# Patient Record
Sex: Female | Born: 1995 | Hispanic: Yes | Marital: Single | Smoking: Never smoker
Health system: Southern US, Community
[De-identification: ages and names within clinical notes are randomized; demographics above are authoritative.]

## PROBLEM LIST (undated history)

## (undated) ENCOUNTER — Encounter: Attending: Internal Medicine | Primary: Internal Medicine

## (undated) ENCOUNTER — Encounter

## (undated) ENCOUNTER — Ambulatory Visit: Payer: MEDICARE | Attending: Internal Medicine | Primary: Internal Medicine

## (undated) ENCOUNTER — Ambulatory Visit: Payer: MEDICARE

## (undated) ENCOUNTER — Ambulatory Visit: Payer: MEDICARE | Attending: Podiatrist | Primary: Podiatrist

## (undated) ENCOUNTER — Ambulatory Visit

## (undated) ENCOUNTER — Encounter: Payer: MEDICARE | Attending: Internal Medicine | Primary: Internal Medicine

## (undated) ENCOUNTER — Ambulatory Visit: Payer: MEDICARE | Attending: Medical | Primary: Medical

## (undated) ENCOUNTER — Ambulatory Visit: Attending: Pharmacist | Primary: Pharmacist

## (undated) ENCOUNTER — Encounter: Attending: Rheumatology | Primary: Rheumatology

## (undated) ENCOUNTER — Telehealth

## (undated) ENCOUNTER — Encounter: Attending: Podiatrist | Primary: Podiatrist

## (undated) ENCOUNTER — Encounter: Attending: Oncology | Primary: Oncology

## (undated) ENCOUNTER — Encounter: Attending: Physical Medicine & Rehabilitation | Primary: Physical Medicine & Rehabilitation

## (undated) ENCOUNTER — Encounter: Attending: Family | Primary: Family

## (undated) ENCOUNTER — Encounter: Payer: MEDICARE | Attending: Infectious Disease | Primary: Infectious Disease

## (undated) ENCOUNTER — Telehealth
Attending: Student in an Organized Health Care Education/Training Program | Primary: Student in an Organized Health Care Education/Training Program

## (undated) ENCOUNTER — Encounter
Payer: MEDICARE | Attending: Student in an Organized Health Care Education/Training Program | Primary: Student in an Organized Health Care Education/Training Program

## (undated) ENCOUNTER — Encounter: Attending: Hospitalist | Primary: Hospitalist

## (undated) ENCOUNTER — Encounter: Attending: Family Medicine | Primary: Family Medicine

## (undated) ENCOUNTER — Telehealth: Attending: Family | Primary: Family

## (undated) ENCOUNTER — Telehealth: Attending: Medical Oncology | Primary: Medical Oncology

## (undated) ENCOUNTER — Telehealth: Attending: Ambulatory Care | Primary: Ambulatory Care

## (undated) ENCOUNTER — Ambulatory Visit: Payer: MEDICARE | Attending: Physical Medicine & Rehabilitation | Primary: Physical Medicine & Rehabilitation

## (undated) ENCOUNTER — Telehealth: Attending: Hospitalist | Primary: Hospitalist

## (undated) ENCOUNTER — Encounter: Payer: MEDICARE | Attending: Rheumatology | Primary: Rheumatology

## (undated) ENCOUNTER — Encounter: Attending: Ambulatory Care | Primary: Ambulatory Care

## (undated) ENCOUNTER — Inpatient Hospital Stay

## (undated) ENCOUNTER — Ambulatory Visit: Payer: MEDICARE | Attending: Family | Primary: Family

## (undated) ENCOUNTER — Ambulatory Visit: Attending: Medical | Primary: Medical

## (undated) ENCOUNTER — Telehealth: Attending: Internal Medicine | Primary: Internal Medicine

## (undated) ENCOUNTER — Ambulatory Visit: Payer: MEDICARE | Attending: Dermatology | Primary: Dermatology

## (undated) ENCOUNTER — Non-Acute Institutional Stay: Payer: MEDICARE | Attending: Rheumatology | Primary: Rheumatology

## (undated) DIAGNOSIS — IMO0002 Reserved for concepts with insufficient information to code with codable children: Secondary | ICD-10-CM

## (undated) DIAGNOSIS — F32A Depression, unspecified: Secondary | ICD-10-CM

## (undated) DIAGNOSIS — M329 Systemic lupus erythematosus, unspecified: Secondary | ICD-10-CM

## (undated) DIAGNOSIS — F419 Anxiety disorder, unspecified: Secondary | ICD-10-CM

## (undated) HISTORY — DX: Depression, unspecified: F32.A

## (undated) HISTORY — DX: Anxiety disorder, unspecified: F41.9

## (undated) MED ORDER — WARFARIN 2 MG TABLET: Freq: Every day | ORAL | 0 days

## (undated) MED ORDER — WARFARIN 5 MG TABLET: Freq: Every day | ORAL | 0 days

---
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
## 2016-10-09 ENCOUNTER — Emergency Department
Admission: EM | Admit: 2016-10-09 | Discharge: 2016-10-09 | Disposition: A | Discharge status: Home / Self Care | Payer: Medicaid Other | Attending: Emergency Medicine | Admitting: Emergency Medicine

## 2016-10-09 ENCOUNTER — Emergency Department: Payer: Medicaid Other

## 2016-10-09 ENCOUNTER — Encounter: Payer: Self-pay | Admitting: Emergency Medicine

## 2016-10-09 DIAGNOSIS — M25551 Pain in right hip: Secondary | ICD-10-CM | POA: Diagnosis present

## 2016-10-09 LAB — POCT PREGNANCY, URINE: Preg Test, Ur: NEGATIVE

## 2016-10-09 MED ORDER — ORPHENADRINE CITRATE 30 MG/ML IJ SOLN
60.0000 mg | Freq: Two times a day (BID) | INTRAMUSCULAR | Status: DC
  Administered 2016-10-09: 60 mg via INTRAMUSCULAR
  Filled 2016-10-09: qty 2

## 2016-10-09 MED ORDER — CYCLOBENZAPRINE HCL 10 MG PO TABS: 10.0000 mg | ORAL_TABLET | Freq: Three times a day (TID) | ORAL | 0 refills | Status: DC | PRN

## 2016-10-09 NOTE — Discharge Instructions (Signed)
Follow up with the orthopedic doctor for symptoms that are not improving over the next few days. Return to the ER for symptoms that change or worsen if unable to schedule an appointment.

## 2016-10-09 NOTE — ED Provider Notes (Signed)
[facility] Emergency Department Provider Note ____________________________________________  Time seen: Approximately 5:31 PM  I have reviewed the triage vital signs and the nursing notes.   HISTORY  Chief Complaint Groin Pain    HPI Jacqueline Black is a 21 y.o. female who presents to the emergency department for evaluation of nontraumatic right groin/hip pain that has been present for the past 4 days. Pain is worse with movement. She denies recalling any type of injury. She has not had these symptoms in the past. She has not taken any over-the-counter medications to help with her pain. 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 She denies skin lesion, erythema, or edema in the area of pain. Pain is described as a "pulling sensation" when she moves her hip.  History reviewed. No pertinent past medical history.  There are no active problems to display for this patient.   No past surgical history on file.  Prior to Admission medications   Medication Sig Start Date End Date Taking? Authorizing Provider  cyclobenzaprine (FLEXERIL) 10 MG tablet Take 1 tablet (10 mg total) by mouth 3 (three) times daily as needed for muscle spasms. 10/09/16   Jacqulyne Gladue, Rulon Eisenmengerari B, FNP  HYDROcodone-acetaminophen (NORCO/VICODIN) 5-325 MG per tablet Take 1 tablet by mouth every 4 (four) hours as needed for moderate pain. 12/12/14   Tommi RumpsSummers, Rhonda L, PA-C    Allergies Patient has no known allergies.  No family history on file.  Social History Social History  Substance Use Topics  . Smoking status: Not on file  . Smokeless tobacco: Not on file  . Alcohol use Not on file    Review of Systems Constitutional: Negative for recent injury or illness. Respiratory: Negative for cough or shortness of breath. Musculoskeletal: Positive for acute pain other right anterior groin/hip Skin:  Negative for rash, lesion, or wound  Neurological: Negative for radiculopathy or paresthesias  ____________________________________________   PHYSICAL EXAM:  VITAL SIGNS: ED Triage Vitals  Enc Vitals Group     BP 10/09/16 1406 119/83     Pulse Rate 10/09/16 1406 96     Resp 10/09/16 1406 17     Temp 10/09/16 1406 98.2 F (36.8 C)     Temp Source 10/09/16 1406 Oral     SpO2 10/09/16 1406 96 %     Weight 10/09/16 1405 245 lb (111.1 kg)     Height 10/09/16 1405 5\' 6"  (1.676 m)     Head Circumference --      Peak Flow --      Pain Score 10/09/16 1405 9     Pain Loc --      Pain Edu? --      Excl. in GC? --     Constitutional: Alert and oriented. Well appearing and in no acute distress. Eyes: Conjunctivae are clear without discharge or drainage.  Head: Atraumatic Neck: Full, active range of motion Respiratory: Respirations are even and unlabored Musculoskeletal: Focal tenderness over the joint space of the anterior right hip. Pain increases with internal or external rotation of the leg. Pain also increases with flexion of the knee. Neurologic: Awake, alert, oriented 4 without radiculopathy or paresthesias.  Skin: Warm, dry without lesion or area of fluctuance consistent with focal abscess   ____________________________________________   LABS (all labs ordered are listed, but only abnormal results are displayed)  Labs Reviewed  POC URINE PREG, ED  POCT  PREGNANCY, URINE   ____________________________________________  RADIOLOGY  Images of the pelvis and the right hip are negative for acute bony abnormality per radiology. ____________________________________________   PROCEDURES  Procedure(s) performed: None  ____________________________________________   INITIAL IMPRESSION / ASSESSMENT AND PLAN / ED COURSE  Jacqueline Black is a 21 y.o. female who presents to the emergency department for evaluation of right groin/hip pain. While in the emergency department,  she was given an injection of Norflex which provided her some relief. Images are reassuring. She will be given a prescription for Flexeril and advised to take Tylenol. She was instructed to follow-up with orthopedics for symptoms that are not improving over the week. She was instructed to return to the emergency department for symptoms that change or worsen if she is unable schedule an appointment.  Pertinent labs & imaging results that were available during my care of the patient were reviewed by me and considered in my medical decision making (see chart for details).  _________________________________________   FINAL CLINICAL IMPRESSION(S) / ED DIAGNOSES  Final diagnoses:  Acute hip pain, right    Discharge Medication List as of 10/09/2016  4:33 PM    START taking these medications   Details  cyclobenzaprine (FLEXERIL) 10 MG tablet Take 1 tablet (10 mg total) by mouth 3 (three) times daily as needed for muscle spasms., Starting Mon 10/09/2016, Print        If controlled substance prescribed during this visit, 12 month history viewed on the [facility] prior to issuing an initial prescription for Schedule II or III opiod.    Chinita Pester, FNP 10/09/16 1738    Myrna Blazer, MD 10/09/16 2111

## 2016-10-09 NOTE — ED Triage Notes (Signed)
Pt reports right groin pain x4 days, reports pain is worse with movement, unsure of injury. Denies leg redness or swelling.

## 2016-10-12 DIAGNOSIS — D6861 Antiphospholipid syndrome: Secondary | ICD-10-CM | POA: Insufficient documentation

## 2016-10-12 DIAGNOSIS — I82431 Acute embolism and thrombosis of right popliteal vein: Secondary | ICD-10-CM | POA: Insufficient documentation

## 2016-10-12 DIAGNOSIS — I2699 Other pulmonary embolism without acute cor pulmonale: Secondary | ICD-10-CM | POA: Insufficient documentation

## 2016-11-13 DIAGNOSIS — Z7901 Long term (current) use of anticoagulants: Secondary | ICD-10-CM | POA: Insufficient documentation

## 2016-12-05 ENCOUNTER — Emergency Department
Admission: EM | Admit: 2016-12-05 | Discharge: 2016-12-05 | Disposition: A | Discharge status: Home / Self Care | Payer: MEDICAID | Source: Intra-hospital | Attending: Emergency Medicine | Admitting: Emergency Medicine

## 2017-02-01 ENCOUNTER — Ambulatory Visit
Admission: RE | Admit: 2017-02-01 | Discharge: 2017-02-01 | Disposition: A | Discharge status: Home / Self Care | Payer: MEDICAID

## 2017-02-01 ENCOUNTER — Ambulatory Visit
Admission: RE | Admit: 2017-02-01 | Discharge: 2017-02-01 | Disposition: A | Discharge status: Home / Self Care | Payer: MEDICAID | Attending: Pediatric Hematology-Oncology | Admitting: Pediatric Hematology-Oncology

## 2017-02-01 DIAGNOSIS — D6861 Antiphospholipid syndrome: Secondary | ICD-10-CM

## 2017-02-01 DIAGNOSIS — I82431 Acute embolism and thrombosis of right popliteal vein: Secondary | ICD-10-CM

## 2017-02-01 DIAGNOSIS — I82411 Acute embolism and thrombosis of right femoral vein: Principal | ICD-10-CM

## 2017-02-23 ENCOUNTER — Ambulatory Visit
Admission: RE | Admit: 2017-02-23 | Discharge: 2017-02-23 | Disposition: A | Discharge status: Home / Self Care | Payer: MEDICAID | Attending: Rheumatology | Admitting: Rheumatology

## 2017-02-23 ENCOUNTER — Ambulatory Visit
Admission: RE | Admit: 2017-02-23 | Discharge: 2017-02-23 | Disposition: A | Discharge status: Home / Self Care | Payer: MEDICAID | Attending: Student in an Organized Health Care Education/Training Program | Admitting: Student in an Organized Health Care Education/Training Program

## 2017-02-23 DIAGNOSIS — I2699 Other pulmonary embolism without acute cor pulmonale: Secondary | ICD-10-CM

## 2017-02-23 DIAGNOSIS — M329 Systemic lupus erythematosus, unspecified: Principal | ICD-10-CM

## 2017-02-23 DIAGNOSIS — G4733 Obstructive sleep apnea (adult) (pediatric): Secondary | ICD-10-CM

## 2017-02-23 DIAGNOSIS — J310 Chronic rhinitis: Secondary | ICD-10-CM

## 2017-02-23 DIAGNOSIS — R040 Epistaxis: Principal | ICD-10-CM

## 2017-02-23 DIAGNOSIS — D6941 Evans syndrome: Secondary | ICD-10-CM

## 2017-02-23 DIAGNOSIS — Z7901 Long term (current) use of anticoagulants: Secondary | ICD-10-CM

## 2017-02-23 MED ORDER — HYDROXYCHLOROQUINE 200 MG TABLET
ORAL_TABLET | Freq: Two times a day (BID) | ORAL | 5 refills | 0 days | Status: CP
Start: 2017-02-23 — End: 2017-07-26

## 2017-03-09 ENCOUNTER — Ambulatory Visit
Admission: RE | Admit: 2017-03-09 | Discharge: 2017-03-09 | Payer: MEDICAID | Attending: Ophthalmology | Admitting: Ophthalmology

## 2017-03-09 DIAGNOSIS — Z79899 Other long term (current) drug therapy: Principal | ICD-10-CM

## 2017-03-20 ENCOUNTER — Ambulatory Visit
Admission: RE | Admit: 2017-03-20 | Discharge: 2017-03-20 | Disposition: A | Discharge status: Home / Self Care | Payer: MEDICAID | Attending: Vascular Surgery | Admitting: Vascular Surgery

## 2017-03-20 DIAGNOSIS — I82501 Chronic embolism and thrombosis of unspecified deep veins of right lower extremity: Principal | ICD-10-CM

## 2017-03-20 MED ORDER — COMPRESSION STOCKING, THIGH HIGH, LONG LENGTH, LARGE CIRCUMFERENCE
0 refills | 0 days | Status: SS
Start: 2017-03-20 — End: 2018-01-27

## 2017-03-29 ENCOUNTER — Ambulatory Visit
Admission: RE | Admit: 2017-03-29 | Discharge: 2017-03-29 | Payer: MEDICAID | Attending: Student in an Organized Health Care Education/Training Program | Admitting: Student in an Organized Health Care Education/Training Program

## 2017-03-29 DIAGNOSIS — D6941 Evans syndrome: Secondary | ICD-10-CM

## 2017-03-29 DIAGNOSIS — R040 Epistaxis: Secondary | ICD-10-CM

## 2017-03-29 DIAGNOSIS — J310 Chronic rhinitis: Principal | ICD-10-CM

## 2017-03-29 DIAGNOSIS — Z7901 Long term (current) use of anticoagulants: Secondary | ICD-10-CM

## 2017-03-29 DIAGNOSIS — I82411 Acute embolism and thrombosis of right femoral vein: Secondary | ICD-10-CM

## 2017-03-29 DIAGNOSIS — R768 Other specified abnormal immunological findings in serum: Secondary | ICD-10-CM

## 2017-04-10 ENCOUNTER — Emergency Department
Admission: EM | Admit: 2017-04-10 | Discharge: 2017-04-10 | Disposition: A | Discharge status: Home / Self Care | Source: Intra-hospital

## 2017-04-10 ENCOUNTER — Emergency Department
Admission: EM | Admit: 2017-04-10 | Discharge: 2017-04-10 | Disposition: A | Discharge status: Home / Self Care | Payer: MEDICAID | Source: Intra-hospital

## 2017-04-10 DIAGNOSIS — M25469 Effusion, unspecified knee: Principal | ICD-10-CM

## 2017-04-10 DIAGNOSIS — M25569 Pain in unspecified knee: Principal | ICD-10-CM

## 2017-04-10 MED ORDER — IBUPROFEN 600 MG TABLET
ORAL_TABLET | Freq: Three times a day (TID) | ORAL | 0 refills | 0.00000 days | Status: CP
Start: 2017-04-10 — End: 2017-04-13

## 2017-04-10 MED ORDER — HYDROMORPHONE 2 MG TABLET
ORAL_TABLET | ORAL | 0 refills | 0.00000 days | Status: CP | PRN
Start: 2017-04-10 — End: 2017-04-15

## 2017-04-10 MED ORDER — ACETAMINOPHEN 325 MG TABLET
ORAL_TABLET | Freq: Four times a day (QID) | ORAL | 0 refills | 0 days | Status: CP | PRN
Start: 2017-04-10 — End: 2017-09-21

## 2017-04-10 MED ORDER — DICLOFENAC 1 % TOPICAL GEL
Freq: Three times a day (TID) | TOPICAL | 2 refills | 0 days | Status: SS
Start: 2017-04-10 — End: 2018-01-27

## 2017-04-13 ENCOUNTER — Emergency Department
Admission: EM | Admit: 2017-04-13 | Discharge: 2017-04-14 | Disposition: A | Discharge status: Home / Self Care | Payer: MEDICAID | Source: Intra-hospital

## 2017-04-13 ENCOUNTER — Ambulatory Visit
Admission: RE | Admit: 2017-04-13 | Discharge: 2017-04-13 | Disposition: A | Discharge status: Home / Self Care | Admitting: Pediatric Hematology-Oncology

## 2017-04-13 DIAGNOSIS — M25562 Pain in left knee: Principal | ICD-10-CM

## 2017-04-13 DIAGNOSIS — Z7901 Long term (current) use of anticoagulants: Secondary | ICD-10-CM

## 2017-04-13 DIAGNOSIS — R040 Epistaxis: Principal | ICD-10-CM

## 2017-04-13 DIAGNOSIS — I82411 Acute embolism and thrombosis of right femoral vein: Secondary | ICD-10-CM

## 2017-04-13 DIAGNOSIS — D6941 Evans syndrome: Secondary | ICD-10-CM

## 2017-04-13 DIAGNOSIS — D6861 Antiphospholipid syndrome: Secondary | ICD-10-CM

## 2017-04-13 MED ORDER — IBUPROFEN 600 MG TABLET
ORAL_TABLET | Freq: Four times a day (QID) | ORAL | 0 refills | 0.00000 days | Status: CP | PRN
Start: 2017-04-13 — End: 2017-09-21

## 2017-04-13 MED ORDER — CEPHALEXIN 500 MG CAPSULE
ORAL_CAPSULE | Freq: Four times a day (QID) | ORAL | 0 refills | 0.00000 days | Status: CP
Start: 2017-04-13 — End: 2017-04-20

## 2017-04-19 DIAGNOSIS — F331 Major depressive disorder, recurrent, moderate: Secondary | ICD-10-CM | POA: Insufficient documentation

## 2017-04-20 ENCOUNTER — Ambulatory Visit
Admission: RE | Admit: 2017-04-20 | Discharge: 2017-04-20 | Disposition: A | Discharge status: Home / Self Care | Payer: MEDICAID | Attending: Rheumatology | Admitting: Rheumatology

## 2017-04-20 DIAGNOSIS — M25561 Pain in right knee: Principal | ICD-10-CM

## 2017-04-20 DIAGNOSIS — M25462 Effusion, left knee: Secondary | ICD-10-CM

## 2017-04-20 DIAGNOSIS — M25461 Effusion, right knee: Secondary | ICD-10-CM

## 2017-04-20 DIAGNOSIS — D6941 Evans syndrome: Secondary | ICD-10-CM

## 2017-04-20 DIAGNOSIS — M25562 Pain in left knee: Secondary | ICD-10-CM

## 2017-04-20 MED ORDER — PREDNISONE 20 MG TABLET
ORAL_TABLET | Freq: Every day | ORAL | 0 refills | 0.00000 days | Status: CP
Start: 2017-04-20 — End: 2018-01-22

## 2017-04-26 ENCOUNTER — Ambulatory Visit
Admission: RE | Admit: 2017-04-26 | Discharge: 2017-04-26 | Payer: MEDICAID | Attending: Student in an Organized Health Care Education/Training Program | Admitting: Student in an Organized Health Care Education/Training Program

## 2017-04-26 ENCOUNTER — Ambulatory Visit: Admission: RE | Admit: 2017-04-26 | Discharge: 2017-04-26

## 2017-04-26 DIAGNOSIS — M25462 Effusion, left knee: Principal | ICD-10-CM

## 2017-04-26 DIAGNOSIS — J310 Chronic rhinitis: Principal | ICD-10-CM

## 2017-04-26 DIAGNOSIS — R040 Epistaxis: Secondary | ICD-10-CM

## 2017-05-31 ENCOUNTER — Encounter
Admit: 2017-05-31 | Discharge: 2017-06-01 | Payer: MEDICARE | Attending: Student in an Organized Health Care Education/Training Program | Primary: Student in an Organized Health Care Education/Training Program

## 2017-05-31 DIAGNOSIS — R040 Epistaxis: Secondary | ICD-10-CM

## 2017-05-31 DIAGNOSIS — Z7901 Long term (current) use of anticoagulants: Principal | ICD-10-CM

## 2017-05-31 DIAGNOSIS — J310 Chronic rhinitis: Secondary | ICD-10-CM

## 2017-07-26 ENCOUNTER — Encounter: Admit: 2017-07-26 | Discharge: 2017-07-27 | Payer: MEDICARE | Attending: Internal Medicine | Primary: Internal Medicine

## 2017-07-26 DIAGNOSIS — D6861 Antiphospholipid syndrome: Secondary | ICD-10-CM

## 2017-07-26 DIAGNOSIS — I2699 Other pulmonary embolism without acute cor pulmonale: Principal | ICD-10-CM

## 2017-07-26 MED ORDER — HYDROXYCHLOROQUINE 200 MG TABLET
ORAL_TABLET | Freq: Two times a day (BID) | ORAL | 5 refills | 0.00000 days | Status: CP
Start: 2017-07-26 — End: 2017-09-21

## 2017-09-14 ENCOUNTER — Encounter: Admit: 2017-09-14 | Discharge: 2017-09-15 | Payer: MEDICARE

## 2017-09-14 DIAGNOSIS — Z01419 Encounter for gynecological examination (general) (routine) without abnormal findings: Principal | ICD-10-CM

## 2017-09-21 ENCOUNTER — Encounter: Admit: 2017-09-21 | Discharge: 2017-09-21 | Payer: MEDICARE | Attending: Rheumatology | Primary: Rheumatology

## 2017-09-21 ENCOUNTER — Encounter: Admit: 2017-09-21 | Discharge: 2017-09-21 | Payer: MEDICARE

## 2017-09-21 DIAGNOSIS — M25561 Pain in right knee: Principal | ICD-10-CM

## 2017-09-21 DIAGNOSIS — D6941 Evans syndrome: Principal | ICD-10-CM

## 2017-09-21 MED ORDER — HYDROXYCHLOROQUINE 200 MG TABLET
ORAL_TABLET | Freq: Two times a day (BID) | ORAL | 5 refills | 0.00000 days | Status: CP
Start: 2017-09-21 — End: 2018-10-07

## 2017-09-30 ENCOUNTER — Encounter: Admit: 2017-09-30 | Discharge: 2017-10-01 | Disposition: A | Discharge status: Home / Self Care | Payer: MEDICARE

## 2017-09-30 ENCOUNTER — Encounter: Admit: 2017-09-30 | Discharge: 2017-09-30 | Disposition: A | Discharge status: Home / Self Care | Payer: MEDICARE

## 2017-09-30 DIAGNOSIS — M79602 Pain in left arm: Principal | ICD-10-CM

## 2017-12-24 ENCOUNTER — Emergency Department
Admission: EM | Admit: 2017-12-24 | Discharge: 2017-12-24 | Disposition: A | Discharge status: Home / Self Care | Payer: Medicaid Other | Attending: Emergency Medicine | Admitting: Emergency Medicine

## 2017-12-24 ENCOUNTER — Encounter: Payer: Self-pay | Admitting: Emergency Medicine

## 2017-12-24 DIAGNOSIS — Z79899 Other long term (current) drug therapy: Secondary | ICD-10-CM | POA: Diagnosis not present

## 2017-12-24 DIAGNOSIS — M79601 Pain in right arm: Secondary | ICD-10-CM | POA: Diagnosis present

## 2017-12-24 DIAGNOSIS — L0291 Cutaneous abscess, unspecified: Secondary | ICD-10-CM

## 2017-12-24 DIAGNOSIS — Z7901 Long term (current) use of anticoagulants: Secondary | ICD-10-CM | POA: Insufficient documentation

## 2017-12-24 DIAGNOSIS — L03113 Cellulitis of right upper limb: Secondary | ICD-10-CM | POA: Diagnosis not present

## 2017-12-24 HISTORY — DX: Systemic lupus erythematosus, unspecified: M32.9

## 2017-12-24 HISTORY — DX: Reserved for concepts with insufficient information to code with codable children: IMO0002

## 2017-12-24 MED ORDER — LIDOCAINE HCL (PF) 1 % IJ SOLN
10.0000 mL | Freq: Once | INTRAMUSCULAR | Status: AC
Start: 2017-12-24 — End: 2017-12-24
  Administered 2017-12-24: 10 mL
  Filled 2017-12-24: qty 10

## 2017-12-24 MED ORDER — HYDROCODONE-ACETAMINOPHEN 5-325 MG PO TABS: 1.0000 | ORAL_TABLET | Freq: Three times a day (TID) | ORAL | 0 refills | Status: AC | PRN

## 2017-12-24 MED ORDER — SULFAMETHOXAZOLE-TRIMETHOPRIM 800-160 MG PO TABS: 1.0000 | ORAL_TABLET | Freq: Two times a day (BID) | ORAL | 0 refills | Status: AC

## 2017-12-24 MED ORDER — SULFAMETHOXAZOLE-TRIMETHOPRIM 800-160 MG PO TABS
1.0000 | ORAL_TABLET | Freq: Once | ORAL | Status: AC
Start: 2017-12-24 — End: 2017-12-24
  Administered 2017-12-24: 1 via ORAL
  Filled 2017-12-24: qty 1

## 2017-12-24 MED ORDER — SULFAMETHOXAZOLE-TRIMETHOPRIM 800-160 MG PO TABS: 1.0000 | ORAL_TABLET | Freq: Two times a day (BID) | ORAL | 0 refills | Status: DC

## 2017-12-24 MED ORDER — HYDROCODONE-ACETAMINOPHEN 5-325 MG PO TABS
1.0000 | ORAL_TABLET | Freq: Once | ORAL | Status: AC
  Administered 2017-12-24: 1 via ORAL
  Filled 2017-12-24: qty 1

## 2017-12-24 NOTE — ED Triage Notes (Signed)
Pt reports has a a bump on her right upper arm that is painful. Pt with red, warm knot noted to right upper arm. Pt reports has been there for 5 days and has used warm compresses but is has not drained.

## 2017-12-24 NOTE — ED Notes (Signed)
See triage note  Presents with a possible abscess area under right arm  States she noticed this area about 5 days ago

## 2017-12-24 NOTE — Discharge Instructions (Signed)
Keep the wound clean, dry, and covered. See your provider in 3 days for packing removal. Apply warm compresses over the dressing for wound healing. Take the antibiotic as directed and the pain medicine as needed.

## 2017-12-26 NOTE — ED Provider Notes (Signed)
[facility] Emergency Department Provider Note ____________________________________________  Time seen: 1041  I have reviewed the triage vital signs and the nursing notes.  HISTORY  Chief Complaint  Abscess  HPI Jacqueline Black is a 22 y.o. female presents to the ED accompanied by her mother, for evaluation of a tender, inflamed, pointing area to her upper right arm.  Reports that the area has been there for the last 5 days, and she is use warm compresses but denies any spontaneous drainage.  She does give a remote history of previous skin abscesses, some requiring incision and drainage.  She denies any interim fevers, but notes some chills.  She also notes significant pain with movement of the right upper extremity.  Past Medical History:  Diagnosis Date  . Lupus (HCC)     There are no active problems to display for this patient.   History reviewed. No pertinent surgical history.  Prior to Admission medications   Medication Sig Start Date End Date Taking? Authorizing Provider  clobetasol cream (TEMOVATE) 0.05 % Apply 1 application topically 2 (two) times daily.   Yes [provider]  hydroxychloroquine (PLAQUENIL) 200 MG tablet Take 400 mg by mouth daily.   Yes [provider]  warfarin (COUMADIN) 6 MG tablet Take 6 mg by mouth daily.   Yes [provider]  HYDROcodone-acetaminophen (NORCO) 5-325 MG tablet Take 1 tablet by mouth 3 (three) times daily as needed for up to 2 days. 12/24/17 12/26/17  Johnedward Brodrick, Charlesetta Ivory, PA-C  sulfamethoxazole-trimethoprim (BACTRIM DS,SEPTRA DS) 800-160 MG tablet Take 1 tablet by mouth 2 (two) times daily for 10 days. 12/24/17 01/03/18  Quayshaun Hubbert, Charlesetta Ivory, PA-C    Allergies Oxycodone  No family history on file.  Social History Social History   Tobacco Use  . Smoking status: Never Smoker  . Smokeless tobacco: Never Used  Substance Use Topics  . Alcohol use: Not on file  . Drug use: Not  on file    Review of Systems  Constitutional: Negative for fever. Eyes: Negative for visual changes. ENT: Negative for sore throat. Cardiovascular: Negative for chest pain. Respiratory: Negative for shortness of breath. Gastrointestinal: Negative for abdominal pain, vomiting and diarrhea. Genitourinary: Negative for dysuria. Musculoskeletal: Negative for back pain. Skin: Negative for rash.  Right UE abscess as above. Neurological: Negative for headaches, focal weakness or numbness. ____________________________________________  PHYSICAL EXAM:  VITAL SIGNS: ED Triage Vitals  Enc Vitals Group     BP 12/24/17 0902 116/68     Pulse Rate 12/24/17 0902 85     Resp 12/24/17 0902 18     Temp 12/24/17 0902 (!) 97.4 F (36.3 C)     Temp Source 12/24/17 0902 Oral     SpO2 12/24/17 0902 98 %     Weight 12/24/17 0907 245 lb (111.1 kg)     Height 12/24/17 0907 5\' 6"  (1.676 m)     Head Circumference --      Peak Flow --      Pain Score 12/24/17 0907 10     Pain Loc --      Pain Edu? --      Excl. in GC? --     Constitutional: Alert and oriented. Well appearing and in no distress. Head: Normocephalic and atraumatic. Eyes: Conjunctivae are normal. Normal extraocular movements Cardiovascular: Normal rate, regular rhythm. Normal distal pulses. Respiratory: Normal respiratory effort. No wheezes/rales/rhonchi. Musculoskeletal: Nontender with normal range of motion in all extremities.  Neurologic:  Normal  gait without ataxia. Normal speech and language. No gross focal neurologic deficits are appreciated. Skin:  Skin is warm, dry and intact. No rash noted.  Patient with tenderness and local erythema to the inner aspect of the right upper arm.  There is induration noted under the skin with some focal fluctuance appreciated.  No spontaneous drainage is noted. ____________________________________________  PROCEDURES  Norco 5-325 mg PO Bactrim DS 1 PO .Marland Kitchen.Incision and Drainage Date/Time:  12/26/2017 6:04 PM Performed by: Lissa HoardMenshew, Estellar Cadena V Bacon, PA-C Authorized by: Lissa HoardMenshew, Myishia Kasik V Bacon, PA-C   Consent:    Consent obtained:  Verbal   Consent given by:  Patient   Risks discussed:  Bleeding and pain Location:    Type:  Abscess   Location:  Upper extremity   Upper extremity location:  Arm   Arm location:  R upper arm Pre-procedure details:    Skin preparation:  Betadine Anesthesia (see MAR for exact dosages):    Anesthesia method:  Local infiltration   Local anesthetic:  Lidocaine 1% w/o epi Procedure type:    Complexity:  Simple Procedure details:    Incision types:  Stab incision   Incision depth:  Subcutaneous   Scalpel blade:  11   Wound management:  Probed and deloculated and irrigated with saline   Drainage:  Purulent and bloody   Drainage amount:  Moderate   Wound treatment:  Wound left open   Packing materials:  1/2 in iodoform gauze   Amount 1/2" iodoform:  6 cm Post-procedure details:    Patient tolerance of procedure:  Tolerated well, no immediate complications  ____________________________________________  INITIAL IMPRESSION / ASSESSMENT AND PLAN / ED COURSE  Patient with ED evaluation of a swollen area to the right upper arm.  Patient's clinical features consistent with a local abscess and surrounding cellulitis.  She agrees to an I&D procedure.  The procedure is successful with a moderate amount of body purulent drainage.  The wound is appropriately packed and dressed.  Wound care instructions and supplies are provided to the patient and her mother.  She is discharged to follow-up with her primary provider or return to the ED as needed in 2 to 3 days for wound check and packing removal.  Prescriptions for Norco and Bactrim are provided.  I reviewed the patient's prescription history over the last 12 months in the multi-state controlled substances database(s) that includes [place], [place], [place], [place], [place], [place], [place], [place], [place], [place], [place], and [place].  Results were notable for no current prescriptions.  ____________________________________________  FINAL CLINICAL IMPRESSION(S) / ED DIAGNOSES  Final diagnoses:  Abscess  Cellulitis of right upper extremity      Karmen StabsMenshew, Charlesetta IvoryJenise V Bacon, PA-C 12/26/17 1807    Myrna BlazerSchaevitz, David Matthew, MD 12/28/17 1538

## 2018-01-11 ENCOUNTER — Other Ambulatory Visit: Payer: Self-pay | Admitting: Family Medicine

## 2018-01-11 DIAGNOSIS — M25561 Pain in right knee: Secondary | ICD-10-CM

## 2018-01-15 ENCOUNTER — Ambulatory Visit
Admission: RE | Admit: 2018-01-15 | Discharge: 2018-01-15 | Disposition: A | Discharge status: Home / Self Care | Payer: Medicaid Other | Source: Ambulatory Visit | Attending: Family Medicine | Admitting: Family Medicine

## 2018-01-15 DIAGNOSIS — D6861 Antiphospholipid syndrome: Secondary | ICD-10-CM | POA: Insufficient documentation

## 2018-01-15 DIAGNOSIS — M25561 Pain in right knee: Secondary | ICD-10-CM | POA: Insufficient documentation

## 2018-01-15 DIAGNOSIS — Z86718 Personal history of other venous thrombosis and embolism: Secondary | ICD-10-CM | POA: Insufficient documentation

## 2018-01-22 ENCOUNTER — Ambulatory Visit: Admit: 2018-01-22 | Discharge: 2018-01-22 | Payer: MEDICARE

## 2018-01-22 ENCOUNTER — Encounter: Admit: 2018-01-22 | Discharge: 2018-01-22 | Payer: MEDICARE

## 2018-01-22 DIAGNOSIS — Z01419 Encounter for gynecological examination (general) (routine) without abnormal findings: Secondary | ICD-10-CM

## 2018-01-22 DIAGNOSIS — R740 Nonspecific elevation of levels of transaminase and lactic acid dehydrogenase [LDH]: Secondary | ICD-10-CM

## 2018-01-22 DIAGNOSIS — D649 Anemia, unspecified: Secondary | ICD-10-CM

## 2018-01-22 DIAGNOSIS — M329 Systemic lupus erythematosus, unspecified: Principal | ICD-10-CM

## 2018-01-23 MED ORDER — PREDNISONE 10 MG TABLET
ORAL_TABLET | Freq: Every day | ORAL | 1 refills | 0.00000 days | Status: SS
Start: 2018-01-23 — End: 2018-01-29

## 2018-01-25 ENCOUNTER — Ambulatory Visit: Admit: 2018-01-25 | Discharge: 2018-01-29 | Disposition: A | Discharge status: Home / Self Care | Payer: MEDICARE

## 2018-01-25 DIAGNOSIS — D6941 Evans syndrome: Principal | ICD-10-CM

## 2018-01-29 MED ORDER — WARFARIN 5 MG TABLET
ORAL_TABLET | Freq: Every day | ORAL | 0 refills | 0.00000 days | Status: CP
Start: 2018-01-29 — End: ?

## 2018-01-29 MED ORDER — PREDNISONE 20 MG TABLET
ORAL_TABLET | Freq: Every day | ORAL | 0 refills | 0.00000 days | Status: CP
Start: 2018-01-29 — End: 2018-02-28

## 2018-01-29 MED ORDER — FOLIC ACID 1 MG TABLET
ORAL_TABLET | Freq: Every day | ORAL | 11 refills | 0.00000 days | Status: CP
Start: 2018-01-29 — End: 2019-01-29

## 2018-01-29 MED ORDER — FAMOTIDINE 40 MG TABLET
ORAL_TABLET | Freq: Every day | ORAL | 0 refills | 0.00000 days | Status: CP
Start: 2018-01-29 — End: 2018-02-26

## 2018-01-29 MED ORDER — CALCIUM CARBONATE 600 MG CALCIUM (1,500 MG) TABLET
ORAL_TABLET | Freq: Two times a day (BID) | ORAL | 0 refills | 0.00000 days | Status: CP
Start: 2018-01-29 — End: 2018-02-28

## 2018-01-30 MED ORDER — CHOLECALCIFEROL (VITAMIN D3) 25 MCG (1,000 UNIT) TABLET
ORAL_TABLET | Freq: Every day | ORAL | 11 refills | 0.00000 days | Status: CP
Start: 2018-01-30 — End: 2019-01-30

## 2018-02-07 ENCOUNTER — Encounter: Admit: 2018-02-07 | Discharge: 2018-02-07 | Payer: MEDICARE | Attending: Internal Medicine | Primary: Internal Medicine

## 2018-02-07 DIAGNOSIS — D6941 Evans syndrome: Secondary | ICD-10-CM

## 2018-02-07 DIAGNOSIS — Z7901 Long term (current) use of anticoagulants: Principal | ICD-10-CM

## 2018-02-11 ENCOUNTER — Ambulatory Visit: Admit: 2018-02-11 | Discharge: 2018-02-12 | Payer: MEDICARE

## 2018-02-11 DIAGNOSIS — D6861 Antiphospholipid syndrome: Secondary | ICD-10-CM

## 2018-02-11 DIAGNOSIS — D6941 Evans syndrome: Secondary | ICD-10-CM

## 2018-02-11 DIAGNOSIS — D590 Drug-induced autoimmune hemolytic anemia: Principal | ICD-10-CM

## 2018-02-14 ENCOUNTER — Encounter: Admit: 2018-02-14 | Discharge: 2018-02-15 | Payer: MEDICARE

## 2018-02-14 DIAGNOSIS — L0292 Furuncle, unspecified: Secondary | ICD-10-CM

## 2018-02-14 DIAGNOSIS — R21 Rash and other nonspecific skin eruption: Secondary | ICD-10-CM

## 2018-02-14 DIAGNOSIS — L209 Atopic dermatitis, unspecified: Principal | ICD-10-CM

## 2018-02-14 DIAGNOSIS — D489 Neoplasm of uncertain behavior, unspecified: Secondary | ICD-10-CM

## 2018-02-14 MED ORDER — CLINDAMYCIN PHOSPHATE 1 % TOPICAL SOLUTION
6 refills | 0 days | Status: CP
Start: 2018-02-14 — End: ?

## 2018-02-18 ENCOUNTER — Encounter: Admit: 2018-02-18 | Discharge: 2018-02-19 | Payer: MEDICARE

## 2018-02-18 DIAGNOSIS — D6941 Evans syndrome: Secondary | ICD-10-CM

## 2018-02-18 DIAGNOSIS — D6861 Antiphospholipid syndrome: Secondary | ICD-10-CM

## 2018-02-18 DIAGNOSIS — D590 Drug-induced autoimmune hemolytic anemia: Principal | ICD-10-CM

## 2018-02-20 ENCOUNTER — Encounter: Admit: 2018-02-20 | Discharge: 2018-02-21 | Payer: MEDICARE

## 2018-02-20 DIAGNOSIS — L709 Acne, unspecified: Principal | ICD-10-CM

## 2018-02-20 MED ORDER — CLINDAMYCIN 1 %-BENZOYL PEROXIDE 5 % TOPICAL GEL
6 refills | 0 days | Status: CP
Start: 2018-02-20 — End: ?

## 2018-02-25 ENCOUNTER — Encounter: Admit: 2018-02-25 | Discharge: 2018-02-26 | Payer: MEDICARE

## 2018-02-25 DIAGNOSIS — D6861 Antiphospholipid syndrome: Secondary | ICD-10-CM

## 2018-02-25 DIAGNOSIS — D6941 Evans syndrome: Secondary | ICD-10-CM

## 2018-02-25 DIAGNOSIS — D590 Drug-induced autoimmune hemolytic anemia: Principal | ICD-10-CM

## 2018-02-26 MED ORDER — FAMOTIDINE 40 MG TABLET
ORAL_TABLET | Freq: Every day | ORAL | 5 refills | 0.00000 days | Status: CP
Start: 2018-02-26 — End: 2018-07-02

## 2018-03-18 ENCOUNTER — Encounter: Admit: 2018-03-18 | Discharge: 2018-03-19 | Payer: MEDICARE

## 2018-03-18 DIAGNOSIS — N921 Excessive and frequent menstruation with irregular cycle: Principal | ICD-10-CM

## 2018-03-18 DIAGNOSIS — Z975 Presence of (intrauterine) contraceptive device: Secondary | ICD-10-CM

## 2018-03-18 MED ORDER — NORETHINDRONE (CONTRACEPTIVE) 0.35 MG TABLET
ORAL_TABLET | Freq: Every day | ORAL | 1 refills | 0.00000 days | Status: CP
Start: 2018-03-18 — End: 2019-03-18

## 2018-03-25 DIAGNOSIS — R519 Headache, unspecified: Secondary | ICD-10-CM | POA: Insufficient documentation

## 2018-04-09 ENCOUNTER — Encounter: Admit: 2018-04-09 | Discharge: 2018-04-10 | Payer: MEDICARE | Attending: Ophthalmology | Primary: Ophthalmology

## 2018-04-09 DIAGNOSIS — Z79899 Other long term (current) drug therapy: Principal | ICD-10-CM

## 2018-04-09 DIAGNOSIS — Z0100 Encounter for examination of eyes and vision without abnormal findings: Secondary | ICD-10-CM

## 2018-04-09 DIAGNOSIS — H04123 Dry eye syndrome of bilateral lacrimal glands: Secondary | ICD-10-CM

## 2018-04-09 DIAGNOSIS — D6861 Antiphospholipid syndrome: Secondary | ICD-10-CM

## 2018-05-07 ENCOUNTER — Encounter: Admit: 2018-05-07 | Discharge: 2018-05-08 | Payer: MEDICARE | Attending: Dermatology | Primary: Dermatology

## 2018-05-07 DIAGNOSIS — L709 Acne, unspecified: Principal | ICD-10-CM

## 2018-05-07 MED ORDER — TRETINOIN 0.025 % TOPICAL CREAM
Freq: Every evening | TOPICAL | 1 refills | 0.00000 days | Status: CP
Start: 2018-05-07 — End: 2019-05-07

## 2018-06-03 DIAGNOSIS — B351 Tinea unguium: Secondary | ICD-10-CM | POA: Insufficient documentation

## 2018-06-18 ENCOUNTER — Encounter
Admit: 2018-06-18 | Discharge: 2018-06-19 | Disposition: A | Discharge status: Home / Self Care | Payer: MEDICARE | Attending: Emergency Medicine

## 2018-06-18 DIAGNOSIS — R0789 Other chest pain: Principal | ICD-10-CM

## 2018-06-18 DIAGNOSIS — Z7901 Long term (current) use of anticoagulants: Secondary | ICD-10-CM | POA: Insufficient documentation

## 2018-06-18 MED ORDER — LIDOCAINE 5 % TOPICAL PATCH
MEDICATED_PATCH | TRANSDERMAL | 0 refills | 0.00000 days | Status: CP
Start: 2018-06-18 — End: 2018-07-18

## 2018-06-21 ENCOUNTER — Encounter: Admit: 2018-06-21 | Discharge: 2018-06-22 | Payer: MEDICARE

## 2018-06-21 DIAGNOSIS — G8929 Other chronic pain: Secondary | ICD-10-CM

## 2018-06-21 DIAGNOSIS — M25462 Effusion, left knee: Secondary | ICD-10-CM

## 2018-06-21 DIAGNOSIS — M25562 Pain in left knee: Principal | ICD-10-CM

## 2018-06-21 DIAGNOSIS — M222X2 Patellofemoral disorders, left knee: Principal | ICD-10-CM

## 2018-07-02 ENCOUNTER — Encounter: Admit: 2018-07-02 | Discharge: 2018-07-03 | Payer: MEDICARE

## 2018-07-02 DIAGNOSIS — E669 Obesity, unspecified: Principal | ICD-10-CM

## 2018-07-02 DIAGNOSIS — I82411 Acute embolism and thrombosis of right femoral vein: Principal | ICD-10-CM

## 2018-07-02 DIAGNOSIS — K760 Fatty (change of) liver, not elsewhere classified: Principal | ICD-10-CM

## 2018-07-02 DIAGNOSIS — D696 Thrombocytopenia, unspecified: Principal | ICD-10-CM

## 2018-07-02 DIAGNOSIS — G5600 Carpal tunnel syndrome, unspecified upper limb: Principal | ICD-10-CM

## 2018-07-02 DIAGNOSIS — E119 Type 2 diabetes mellitus without complications: Principal | ICD-10-CM

## 2018-07-02 DIAGNOSIS — M329 Systemic lupus erythematosus, unspecified: Principal | ICD-10-CM

## 2018-07-02 DIAGNOSIS — F329 Major depressive disorder, single episode, unspecified: Principal | ICD-10-CM

## 2018-07-02 DIAGNOSIS — O99340 Other mental disorders complicating pregnancy, unspecified trimester: Principal | ICD-10-CM

## 2018-07-02 DIAGNOSIS — D689 Coagulation defect, unspecified: Principal | ICD-10-CM

## 2018-07-02 DIAGNOSIS — T07XXXA Unspecified multiple injuries, initial encounter: Principal | ICD-10-CM

## 2018-07-02 DIAGNOSIS — M2550 Pain in unspecified joint: Principal | ICD-10-CM

## 2018-07-02 DIAGNOSIS — G43909 Migraine, unspecified, not intractable, without status migrainosus: Principal | ICD-10-CM

## 2018-07-02 DIAGNOSIS — I2699 Other pulmonary embolism without acute cor pulmonale: Principal | ICD-10-CM

## 2018-07-02 DIAGNOSIS — F419 Anxiety disorder, unspecified: Principal | ICD-10-CM

## 2018-07-02 DIAGNOSIS — D6941 Evans syndrome: Principal | ICD-10-CM

## 2018-07-10 ENCOUNTER — Encounter: Admit: 2018-07-10 | Discharge: 2018-07-11 | Payer: MEDICARE | Attending: Ophthalmology | Primary: Ophthalmology

## 2018-07-10 DIAGNOSIS — I82411 Acute embolism and thrombosis of right femoral vein: Principal | ICD-10-CM

## 2018-07-10 DIAGNOSIS — M329 Systemic lupus erythematosus, unspecified: Principal | ICD-10-CM

## 2018-07-10 DIAGNOSIS — E669 Obesity, unspecified: Principal | ICD-10-CM

## 2018-07-10 DIAGNOSIS — H43399 Other vitreous opacities, unspecified eye: Secondary | ICD-10-CM

## 2018-07-10 DIAGNOSIS — D689 Coagulation defect, unspecified: Principal | ICD-10-CM

## 2018-07-10 DIAGNOSIS — D696 Thrombocytopenia, unspecified: Principal | ICD-10-CM

## 2018-07-10 DIAGNOSIS — K760 Fatty (change of) liver, not elsewhere classified: Principal | ICD-10-CM

## 2018-07-10 DIAGNOSIS — Z79899 Other long term (current) drug therapy: Principal | ICD-10-CM

## 2018-07-10 DIAGNOSIS — H5319 Other subjective visual disturbances: Secondary | ICD-10-CM

## 2018-07-10 DIAGNOSIS — D6861 Antiphospholipid syndrome: Principal | ICD-10-CM

## 2018-07-10 DIAGNOSIS — O99340 Other mental disorders complicating pregnancy, unspecified trimester: Principal | ICD-10-CM

## 2018-07-10 DIAGNOSIS — I2699 Other pulmonary embolism without acute cor pulmonale: Principal | ICD-10-CM

## 2018-07-10 DIAGNOSIS — H04123 Dry eye syndrome of bilateral lacrimal glands: Principal | ICD-10-CM

## 2018-07-10 DIAGNOSIS — F329 Major depressive disorder, single episode, unspecified: Principal | ICD-10-CM

## 2018-07-10 DIAGNOSIS — E119 Type 2 diabetes mellitus without complications: Principal | ICD-10-CM

## 2018-07-10 DIAGNOSIS — G5600 Carpal tunnel syndrome, unspecified upper limb: Principal | ICD-10-CM

## 2018-07-10 DIAGNOSIS — G43909 Migraine, unspecified, not intractable, without status migrainosus: Principal | ICD-10-CM

## 2018-07-10 DIAGNOSIS — Z0100 Encounter for examination of eyes and vision without abnormal findings: Principal | ICD-10-CM

## 2018-07-10 DIAGNOSIS — D6941 Evans syndrome: Principal | ICD-10-CM

## 2018-07-10 DIAGNOSIS — M2550 Pain in unspecified joint: Principal | ICD-10-CM

## 2018-07-10 DIAGNOSIS — T07XXXA Unspecified multiple injuries, initial encounter: Principal | ICD-10-CM

## 2018-07-10 DIAGNOSIS — F419 Anxiety disorder, unspecified: Principal | ICD-10-CM

## 2018-07-17 DIAGNOSIS — Z5181 Encounter for therapeutic drug level monitoring: Principal | ICD-10-CM

## 2018-07-17 DIAGNOSIS — Z79899 Other long term (current) drug therapy: Principal | ICD-10-CM

## 2018-07-17 DIAGNOSIS — M329 Systemic lupus erythematosus, unspecified: Principal | ICD-10-CM

## 2018-07-17 MED ORDER — AZATHIOPRINE 50 MG TABLET
ORAL_TABLET | Freq: Every day | ORAL | 0 refills | 0.00000 days | Status: CP
Start: 2018-07-17 — End: 2018-10-07

## 2018-08-13 ENCOUNTER — Encounter: Admit: 2018-08-13 | Discharge: 2018-08-14 | Payer: MEDICARE | Attending: Dermatology | Primary: Dermatology

## 2018-08-13 DIAGNOSIS — L709 Acne, unspecified: Secondary | ICD-10-CM

## 2018-08-13 DIAGNOSIS — B353 Tinea pedis: Principal | ICD-10-CM

## 2018-08-13 DIAGNOSIS — L932 Other local lupus erythematosus: Secondary | ICD-10-CM

## 2018-08-13 DIAGNOSIS — R21 Rash and other nonspecific skin eruption: Secondary | ICD-10-CM

## 2018-08-13 MED ORDER — FLUCONAZOLE 150 MG TABLET
ORAL_TABLET | ORAL | 0 refills | 0.00000 days | Status: CP
Start: 2018-08-13 — End: 2018-09-18

## 2018-09-27 DIAGNOSIS — M5412 Radiculopathy, cervical region: Secondary | ICD-10-CM | POA: Insufficient documentation

## 2018-09-27 DIAGNOSIS — N939 Abnormal uterine and vaginal bleeding, unspecified: Secondary | ICD-10-CM | POA: Insufficient documentation

## 2018-10-03 ENCOUNTER — Encounter: Admit: 2018-10-03 | Discharge: 2018-10-04 | Payer: MEDICARE | Attending: Internal Medicine | Primary: Internal Medicine

## 2018-10-03 DIAGNOSIS — I82411 Acute embolism and thrombosis of right femoral vein: Principal | ICD-10-CM

## 2018-10-03 DIAGNOSIS — D6941 Evans syndrome: Secondary | ICD-10-CM

## 2018-10-03 DIAGNOSIS — I2699 Other pulmonary embolism without acute cor pulmonale: Secondary | ICD-10-CM

## 2018-10-07 ENCOUNTER — Encounter: Admit: 2018-10-07 | Discharge: 2018-10-07 | Payer: MEDICARE

## 2018-10-07 DIAGNOSIS — D6941 Evans syndrome: Secondary | ICD-10-CM

## 2018-10-07 DIAGNOSIS — D649 Anemia, unspecified: Principal | ICD-10-CM

## 2018-10-07 DIAGNOSIS — Z5181 Encounter for therapeutic drug level monitoring: Secondary | ICD-10-CM

## 2018-10-07 DIAGNOSIS — M329 Systemic lupus erythematosus, unspecified: Principal | ICD-10-CM

## 2018-10-07 DIAGNOSIS — Z79899 Other long term (current) drug therapy: Secondary | ICD-10-CM

## 2018-10-07 MED ORDER — PREDNISONE 5 MG TABLET
ORAL_TABLET | ORAL | 1 refills | 0 days | Status: CP
Start: 2018-10-07 — End: 2018-10-27

## 2018-10-07 MED ORDER — AZATHIOPRINE 100 MG TABLET
ORAL_TABLET | Freq: Every day | ORAL | 0 refills | 0 days | Status: CP
Start: 2018-10-07 — End: 2019-01-05

## 2018-10-07 MED ORDER — HYDROXYCHLOROQUINE 200 MG TABLET
ORAL_TABLET | Freq: Two times a day (BID) | ORAL | 3 refills | 0.00000 days | Status: CP
Start: 2018-10-07 — End: ?

## 2018-10-14 ENCOUNTER — Encounter: Admit: 2018-10-14 | Discharge: 2018-10-15 | Payer: MEDICARE

## 2018-10-14 DIAGNOSIS — N921 Excessive and frequent menstruation with irregular cycle: Principal | ICD-10-CM

## 2018-10-14 DIAGNOSIS — Z975 Presence of (intrauterine) contraceptive device: Secondary | ICD-10-CM

## 2018-10-22 ENCOUNTER — Encounter: Admit: 2018-10-22 | Discharge: 2018-10-23 | Payer: MEDICARE

## 2018-10-22 DIAGNOSIS — N921 Excessive and frequent menstruation with irregular cycle: Principal | ICD-10-CM

## 2018-11-06 DIAGNOSIS — M25571 Pain in right ankle and joints of right foot: Secondary | ICD-10-CM | POA: Insufficient documentation

## 2018-11-12 ENCOUNTER — Encounter: Admit: 2018-11-12 | Discharge: 2018-11-13 | Payer: MEDICARE

## 2018-11-12 DIAGNOSIS — M5412 Radiculopathy, cervical region: Principal | ICD-10-CM

## 2018-12-10 ENCOUNTER — Encounter: Admit: 2018-12-10 | Discharge: 2018-12-11 | Payer: MEDICARE

## 2018-12-10 DIAGNOSIS — M5412 Radiculopathy, cervical region: Principal | ICD-10-CM

## 2018-12-10 DIAGNOSIS — G5622 Lesion of ulnar nerve, left upper limb: Secondary | ICD-10-CM

## 2018-12-10 MED ORDER — GABAPENTIN 100 MG CAPSULE
ORAL_CAPSULE | Freq: Every evening | ORAL | 1 refills | 60 days | Status: CP | PRN
Start: 2018-12-10 — End: 2019-12-10

## 2018-12-23 ENCOUNTER — Encounter
Admit: 2018-12-23 | Discharge: 2019-01-21 | Payer: MEDICARE | Attending: Rehabilitative and Restorative Service Providers" | Primary: Rehabilitative and Restorative Service Providers"

## 2018-12-23 ENCOUNTER — Encounter
Admit: 2018-12-23 | Discharge: 2019-01-21 | Payer: MEDICARE | Attending: Student in an Organized Health Care Education/Training Program | Primary: Student in an Organized Health Care Education/Training Program

## 2018-12-23 DIAGNOSIS — M5412 Radiculopathy, cervical region: Secondary | ICD-10-CM

## 2018-12-23 DIAGNOSIS — G5622 Lesion of ulnar nerve, left upper limb: Principal | ICD-10-CM

## 2018-12-23 DIAGNOSIS — G540 Brachial plexus disorders: Secondary | ICD-10-CM

## 2019-01-02 DIAGNOSIS — G540 Brachial plexus disorders: Secondary | ICD-10-CM

## 2019-01-02 DIAGNOSIS — M5412 Radiculopathy, cervical region: Secondary | ICD-10-CM

## 2019-01-02 DIAGNOSIS — G5622 Lesion of ulnar nerve, left upper limb: Secondary | ICD-10-CM

## 2019-01-22 ENCOUNTER — Encounter: Admit: 2019-01-22 | Discharge: 2019-01-22 | Payer: MEDICARE

## 2019-01-22 ENCOUNTER — Encounter
Admit: 2019-01-22 | Discharge: 2019-02-20 | Payer: MEDICARE | Attending: Student in an Organized Health Care Education/Training Program | Primary: Student in an Organized Health Care Education/Training Program

## 2019-01-22 ENCOUNTER — Ambulatory Visit
Admit: 2019-01-22 | Discharge: 2019-02-20 | Payer: MEDICARE | Attending: Student in an Organized Health Care Education/Training Program | Primary: Student in an Organized Health Care Education/Training Program

## 2019-01-22 DIAGNOSIS — K760 Fatty (change of) liver, not elsewhere classified: Secondary | ICD-10-CM

## 2019-01-22 DIAGNOSIS — Z86718 Personal history of other venous thrombosis and embolism: Secondary | ICD-10-CM

## 2019-01-22 DIAGNOSIS — Z7901 Long term (current) use of anticoagulants: Secondary | ICD-10-CM

## 2019-01-22 DIAGNOSIS — Z823 Family history of stroke: Secondary | ICD-10-CM

## 2019-01-22 DIAGNOSIS — Z23 Encounter for immunization: Secondary | ICD-10-CM

## 2019-01-22 DIAGNOSIS — R200 Anesthesia of skin: Secondary | ICD-10-CM

## 2019-01-22 DIAGNOSIS — Z79899 Other long term (current) drug therapy: Secondary | ICD-10-CM

## 2019-01-22 DIAGNOSIS — Z86711 Personal history of pulmonary embolism: Secondary | ICD-10-CM

## 2019-01-22 DIAGNOSIS — D6941 Evans syndrome: Secondary | ICD-10-CM

## 2019-01-22 DIAGNOSIS — M329 Systemic lupus erythematosus, unspecified: Secondary | ICD-10-CM

## 2019-01-22 DIAGNOSIS — D689 Coagulation defect, unspecified: Secondary | ICD-10-CM

## 2019-01-22 DIAGNOSIS — E119 Type 2 diabetes mellitus without complications: Secondary | ICD-10-CM

## 2019-01-22 DIAGNOSIS — E669 Obesity, unspecified: Secondary | ICD-10-CM

## 2019-01-22 DIAGNOSIS — R202 Paresthesia of skin: Secondary | ICD-10-CM

## 2019-01-22 DIAGNOSIS — F329 Major depressive disorder, single episode, unspecified: Secondary | ICD-10-CM

## 2019-01-22 DIAGNOSIS — Z9049 Acquired absence of other specified parts of digestive tract: Secondary | ICD-10-CM

## 2019-01-22 DIAGNOSIS — G5622 Lesion of ulnar nerve, left upper limb: Secondary | ICD-10-CM

## 2019-01-22 DIAGNOSIS — Z818 Family history of other mental and behavioral disorders: Secondary | ICD-10-CM

## 2019-02-03 ENCOUNTER — Encounter: Admit: 2019-02-03 | Discharge: 2019-02-04 | Payer: MEDICARE

## 2019-02-03 DIAGNOSIS — D6861 Antiphospholipid syndrome: Secondary | ICD-10-CM

## 2019-02-03 DIAGNOSIS — Z5181 Encounter for therapeutic drug level monitoring: Secondary | ICD-10-CM

## 2019-02-03 DIAGNOSIS — Z79899 Other long term (current) drug therapy: Secondary | ICD-10-CM

## 2019-02-03 DIAGNOSIS — D693 Immune thrombocytopenic purpura: Secondary | ICD-10-CM

## 2019-02-03 DIAGNOSIS — M329 Systemic lupus erythematosus, unspecified: Secondary | ICD-10-CM

## 2019-02-03 MED ORDER — PREDNISONE 10 MG TABLET
ORAL_TABLET | 0 refills | 0 days | Status: SS
Start: 2019-02-03 — End: ?

## 2019-02-07 ENCOUNTER — Encounter: Admit: 2019-02-07 | Discharge: 2019-02-08 | Payer: MEDICARE

## 2019-02-10 ENCOUNTER — Ambulatory Visit
Admit: 2019-02-10 | Discharge: 2019-02-23 | Disposition: A | Discharge status: Home / Self Care | Payer: MEDICARE | Admitting: Internal Medicine

## 2019-02-11 DIAGNOSIS — Z6836 Body mass index (BMI) 36.0-36.9, adult: Secondary | ICD-10-CM | POA: Insufficient documentation

## 2019-02-11 DIAGNOSIS — Z7952 Long term (current) use of systemic steroids: Secondary | ICD-10-CM | POA: Insufficient documentation

## 2019-02-14 MED ORDER — ENOXAPARIN 100 MG/ML SUBCUTANEOUS SYRINGE: 100 mg | mL | Freq: Two times a day (BID) | 0 refills | 20 days

## 2019-02-22 MED ORDER — AZATHIOPRINE 100 MG TABLET
ORAL_TABLET | Freq: Every day | ORAL | 0 refills | 90.00000 days
Start: 2019-02-22 — End: 2019-05-23

## 2019-02-22 MED ORDER — HYDROXYCHLOROQUINE 200 MG TABLET
ORAL_TABLET | Freq: Two times a day (BID) | ORAL | 0 refills | 45.00000 days | Status: CP
Start: 2019-02-22 — End: ?
  Filled 2019-02-22: qty 60, 30d supply, fill #0

## 2019-02-22 MED ORDER — ENOXAPARIN 100 MG/ML SUBCUTANEOUS SYRINGE
Freq: Two times a day (BID) | SUBCUTANEOUS | 0 refills | 7.00000 days | Status: CP
Start: 2019-02-22 — End: ?
  Filled 2019-02-22: qty 14, 7d supply, fill #0

## 2019-02-22 MED ORDER — WARFARIN 2 MG TABLET
ORAL_TABLET | Freq: Every day | ORAL | 2 refills | 8.00000 days | Status: CP
Start: 2019-02-22 — End: 2020-02-22
  Filled 2019-02-22: qty 30, 8d supply, fill #0

## 2019-02-22 MED FILL — HYDROXYCHLOROQUINE 200 MG TABLET: 30 days supply | Qty: 60 | Fill #0 | Status: AC

## 2019-02-22 MED FILL — ENOXAPARIN 100 MG/ML SUBCUTANEOUS SYRINGE: 7 days supply | Qty: 14 | Fill #0 | Status: AC

## 2019-02-22 MED FILL — WARFARIN 2 MG TABLET: 8 days supply | Qty: 30 | Fill #0 | Status: AC

## 2019-02-25 DIAGNOSIS — D6941 Evans syndrome: Principal | ICD-10-CM

## 2019-02-27 ENCOUNTER — Encounter: Admit: 2019-02-27 | Discharge: 2019-02-28 | Payer: MEDICARE

## 2019-02-27 DIAGNOSIS — D6941 Evans syndrome: Principal | ICD-10-CM

## 2019-03-03 ENCOUNTER — Telehealth: Admit: 2019-03-03 | Discharge: 2019-03-04 | Payer: MEDICARE | Attending: Internal Medicine | Primary: Internal Medicine

## 2019-03-20 ENCOUNTER — Encounter: Admit: 2019-03-20 | Discharge: 2019-03-20 | Payer: MEDICARE

## 2019-03-21 DIAGNOSIS — R040 Epistaxis: Secondary | ICD-10-CM | POA: Insufficient documentation

## 2019-03-22 ENCOUNTER — Ambulatory Visit
Admit: 2019-03-22 | Discharge: 2019-03-25 | Disposition: A | Discharge status: Home / Self Care | Payer: MEDICARE | Admitting: Internal Medicine

## 2019-03-25 MED ORDER — METFORMIN 500 MG TABLET
ORAL_TABLET | Freq: Every day | ORAL | 0 refills | 30.00000 days | Status: CP
Start: 2019-03-25 — End: 2019-04-24

## 2019-03-26 MED ORDER — DEXAMETHASONE 4 MG TABLET
ORAL_TABLET | ORAL | 0 refills | 28 days | Status: CP
Start: 2019-03-26 — End: 2019-03-25

## 2019-03-26 MED ORDER — PREDNISONE 10 MG TABLET
ORAL_TABLET | ORAL | 0 refills | 28 days | Status: CP
Start: 2019-03-26 — End: 2019-04-23

## 2019-04-04 IMAGING — CR DG HIP (WITH OR WITHOUT PELVIS) 2-3V*R*
1 series · 3 of 3 positions shown · non-contrast
Comparison: None.

CLINICAL DATA: Patient reports onset of right hip pain x 5 days
ago. No injury to cause pain. No previous injury to hip.

EXAM:
DG HIP (WITH OR WITHOUT PELVIS) 2-3V RIGHT

[Series 1: dg hip unilat w or w/o pelvis 2-3 views  · non-contrast · 0.14mm/px · 3 of 3 slices shown]
[im 1/3]
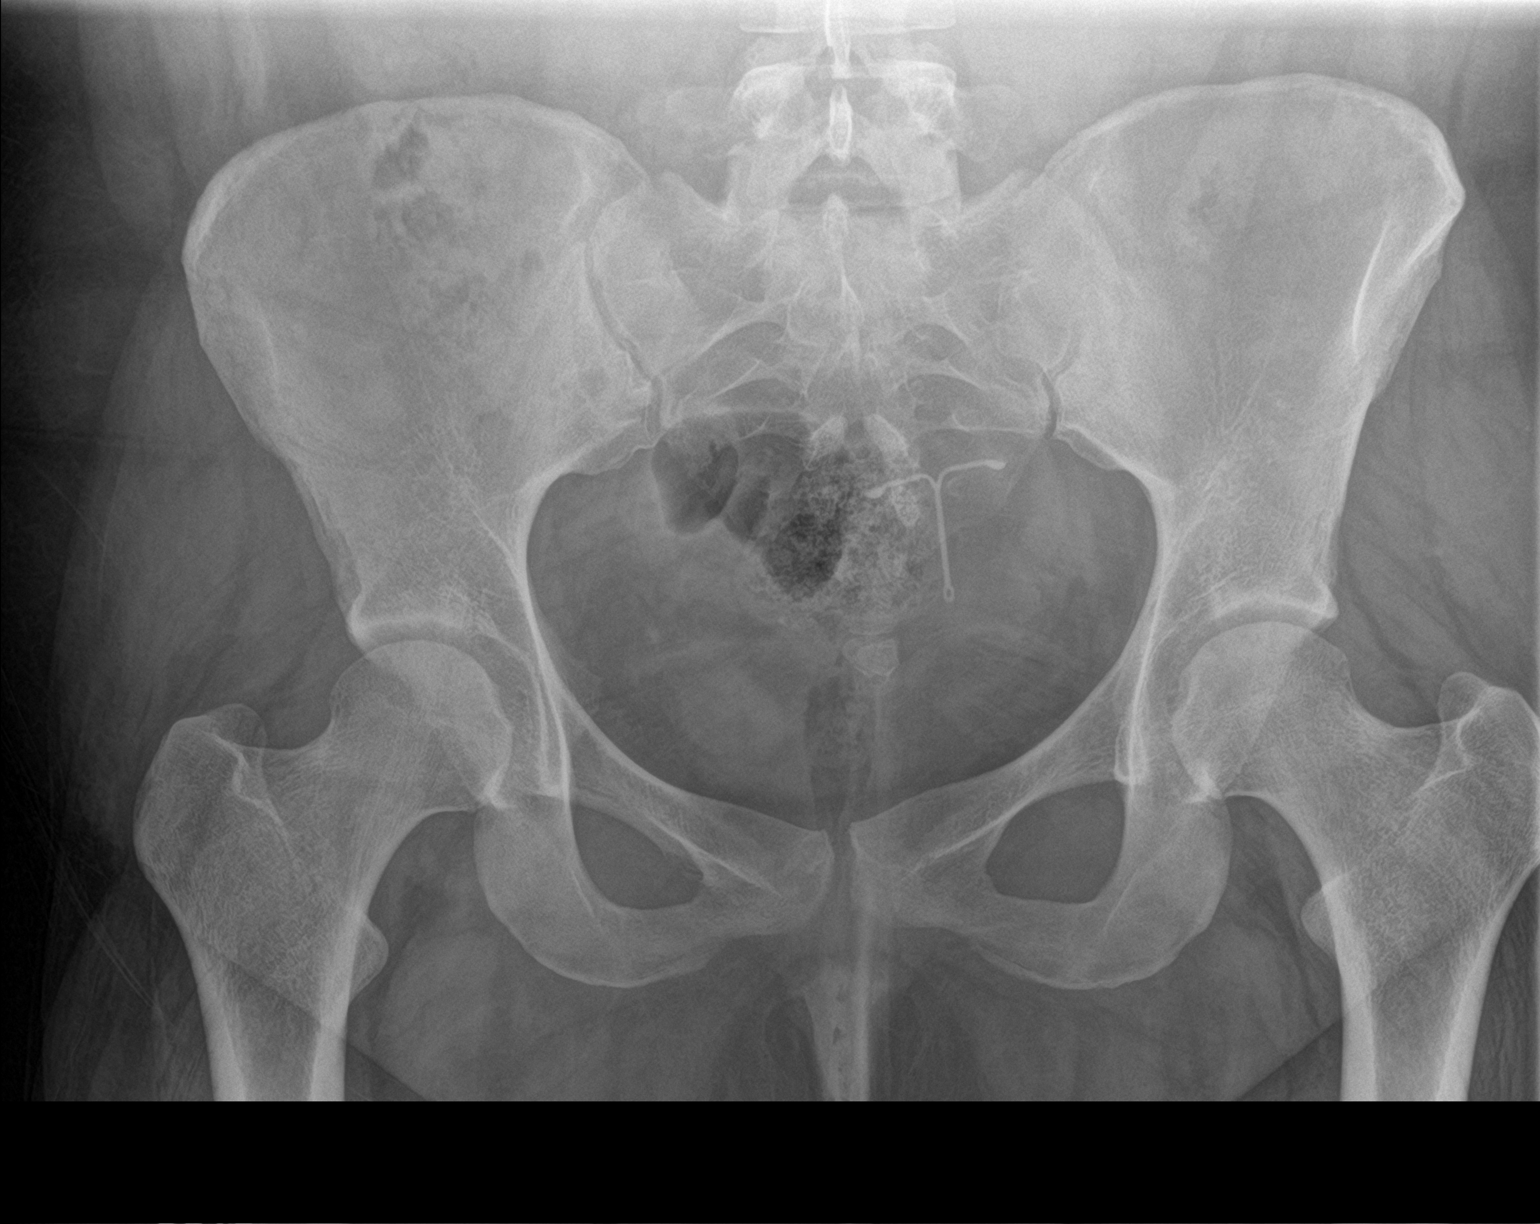
[im 2/3]
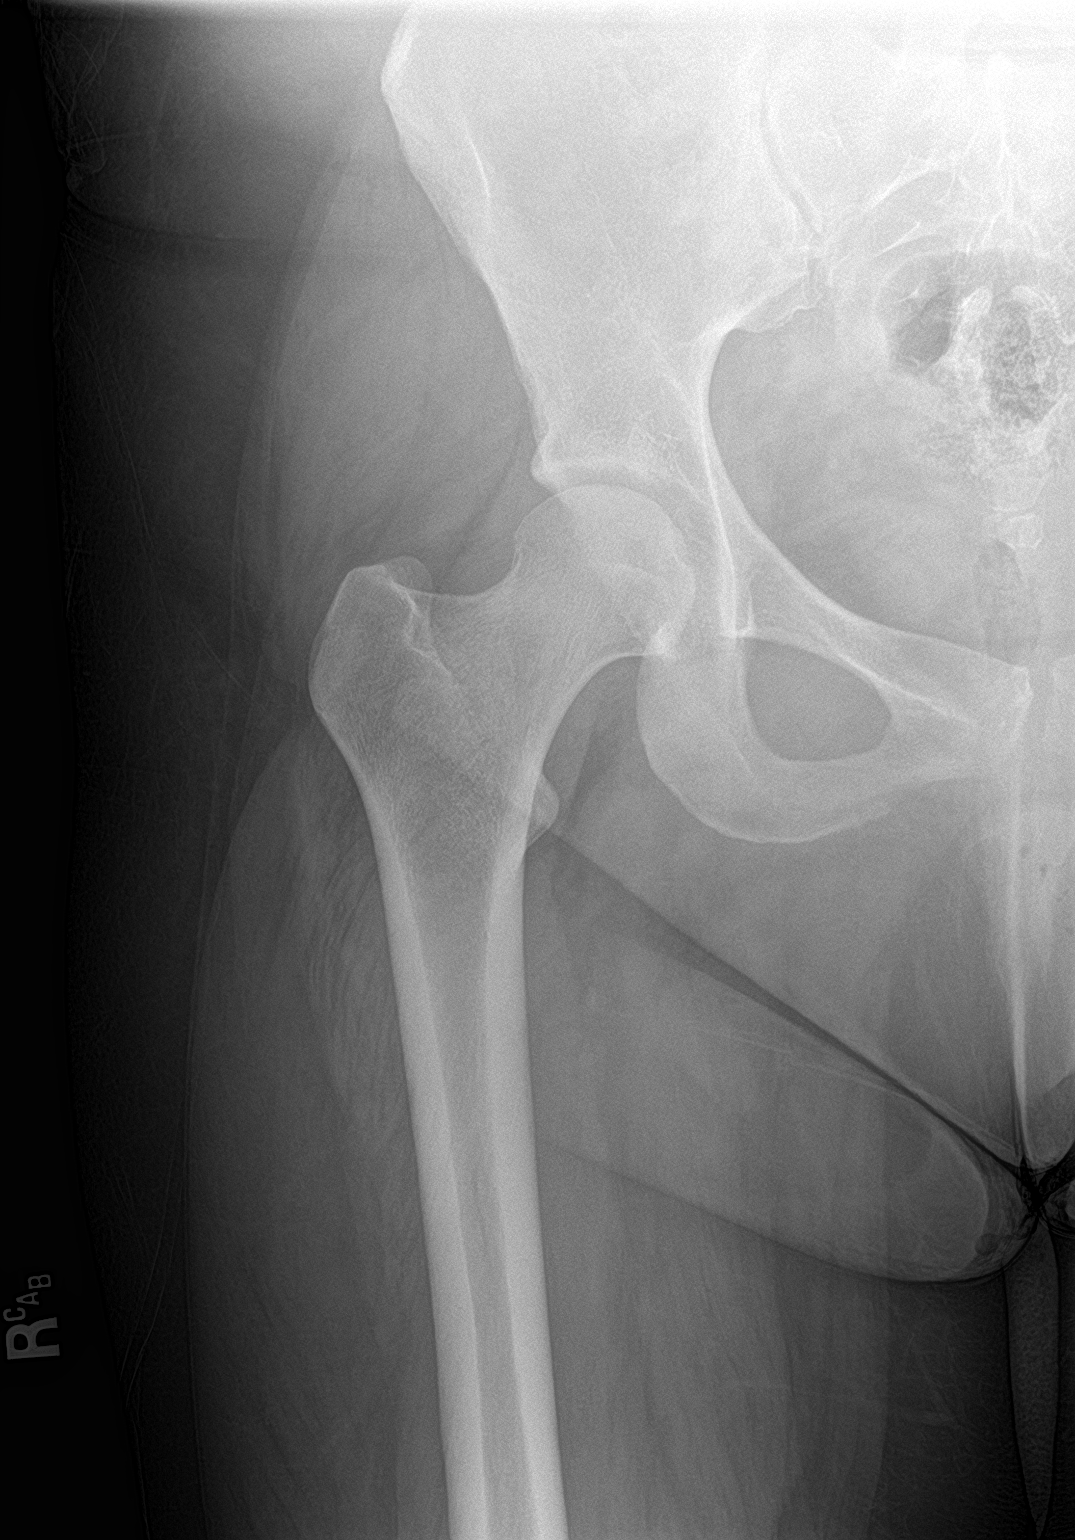
[im 3/3]
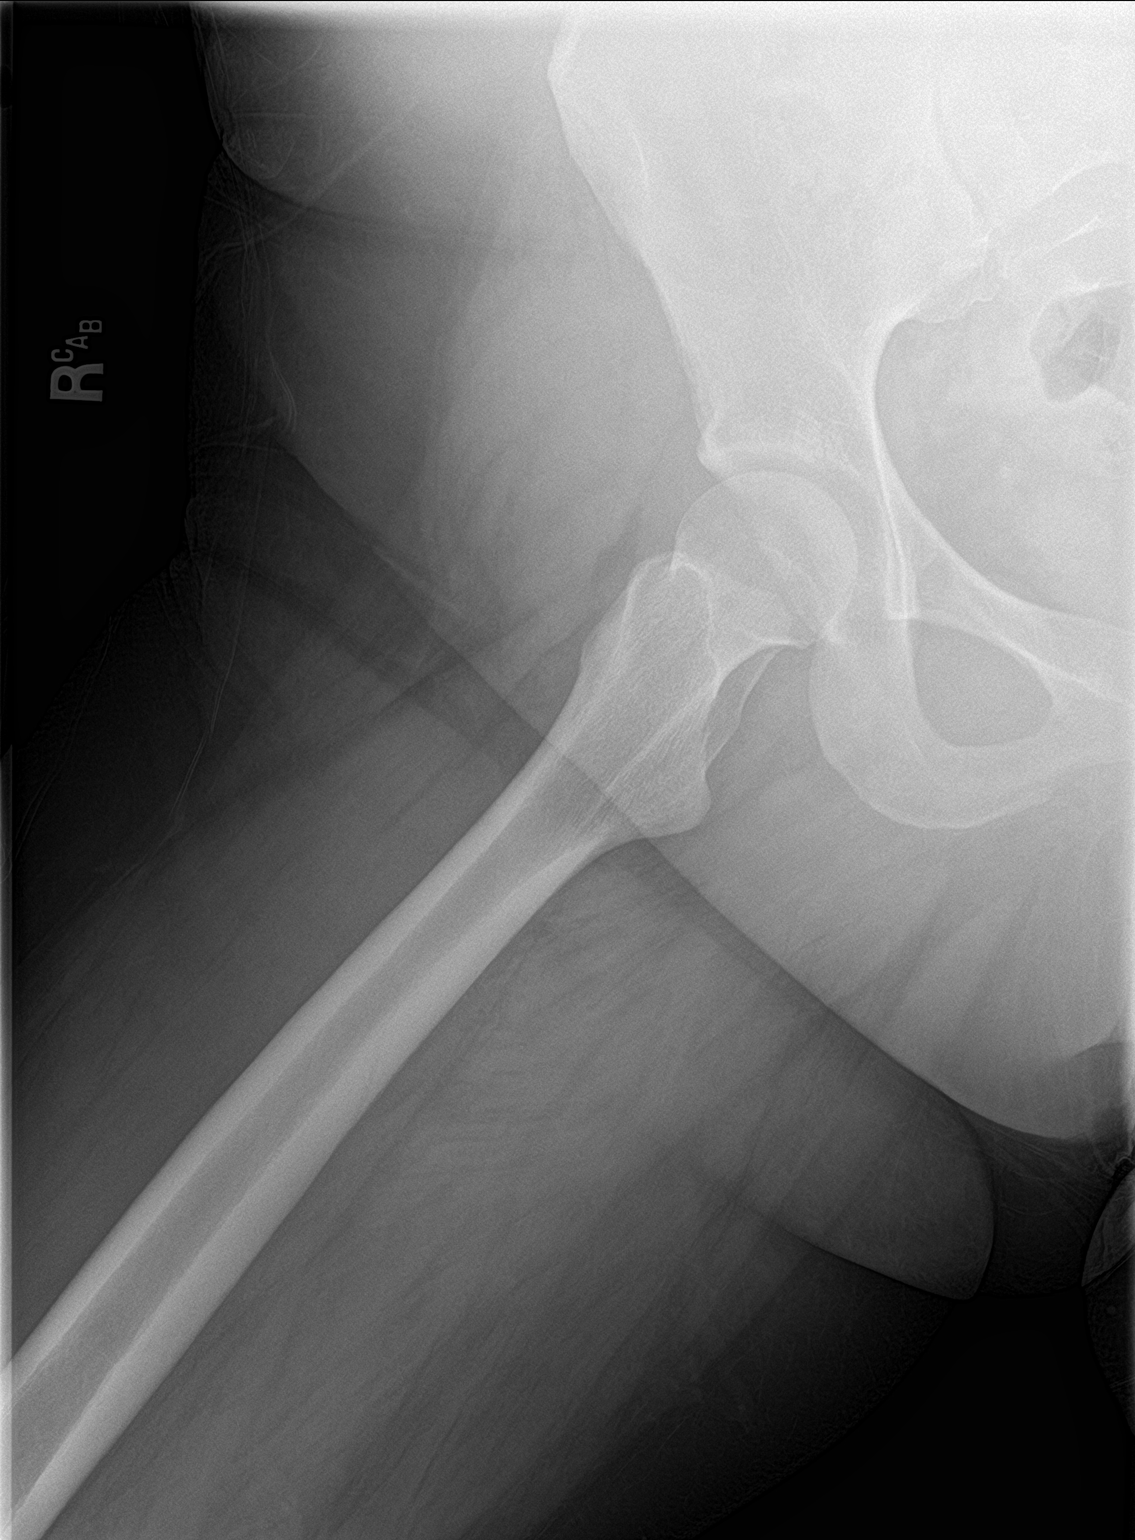

[3 of 3 positions shown; findings below may reference images not displayed]

FINDINGS: There is no evidence of hip fracture or dislocation. There is no
evidence of arthropathy or other focal bone abnormality. IUD within
the central pelvis, just to the left of midline.
IMPRESSION: Negative.

## 2019-04-21 ENCOUNTER — Encounter: Admit: 2019-04-21 | Discharge: 2019-04-21 | Payer: MEDICARE

## 2019-04-21 ENCOUNTER — Ambulatory Visit: Admit: 2019-04-21 | Discharge: 2019-04-21 | Payer: MEDICARE | Attending: Internal Medicine | Primary: Internal Medicine

## 2019-04-21 DIAGNOSIS — D6941 Evans syndrome: Principal | ICD-10-CM

## 2019-04-21 DIAGNOSIS — Z7901 Long term (current) use of anticoagulants: Principal | ICD-10-CM

## 2019-04-21 DIAGNOSIS — D6861 Antiphospholipid syndrome: Principal | ICD-10-CM

## 2019-05-25 ENCOUNTER — Encounter
Admit: 2019-05-25 | Discharge: 2019-05-26 | Disposition: A | Discharge status: Home / Self Care | Payer: MEDICARE | Attending: Emergency Medicine

## 2019-05-25 ENCOUNTER — Ambulatory Visit
Admit: 2019-05-25 | Discharge: 2019-05-26 | Disposition: A | Discharge status: Home / Self Care | Payer: MEDICARE | Attending: Emergency Medicine

## 2019-05-25 DIAGNOSIS — R0602 Shortness of breath: Principal | ICD-10-CM

## 2019-05-25 DIAGNOSIS — I2782 Chronic pulmonary embolism: Principal | ICD-10-CM

## 2019-05-29 ENCOUNTER — Encounter: Admit: 2019-05-29 | Discharge: 2019-05-29 | Payer: MEDICARE

## 2019-05-29 ENCOUNTER — Ambulatory Visit: Admit: 2019-05-29 | Discharge: 2019-05-29 | Payer: MEDICARE | Attending: Family | Primary: Family

## 2019-05-29 DIAGNOSIS — R202 Paresthesia of skin: Principal | ICD-10-CM

## 2019-05-29 DIAGNOSIS — M329 Systemic lupus erythematosus, unspecified: Principal | ICD-10-CM

## 2019-05-29 DIAGNOSIS — D6941 Evans syndrome: Principal | ICD-10-CM

## 2019-05-29 DIAGNOSIS — R0602 Shortness of breath: Principal | ICD-10-CM

## 2019-05-29 MED ORDER — HYDROXYCHLOROQUINE 200 MG TABLET
ORAL_TABLET | Freq: Two times a day (BID) | ORAL | 0 refills | 45 days | Status: CP
Start: 2019-05-29 — End: ?

## 2019-05-29 MED ORDER — AZATHIOPRINE 100 MG TABLET
ORAL_TABLET | Freq: Every day | ORAL | 0 refills | 90.00000 days | Status: CP
Start: 2019-05-29 — End: 2019-08-27

## 2019-06-13 ENCOUNTER — Encounter: Admit: 2019-06-13 | Discharge: 2019-06-14 | Payer: MEDICARE

## 2019-06-13 DIAGNOSIS — I2782 Chronic pulmonary embolism: Principal | ICD-10-CM

## 2019-06-13 DIAGNOSIS — G4733 Obstructive sleep apnea (adult) (pediatric): Principal | ICD-10-CM

## 2019-06-13 DIAGNOSIS — R002 Palpitations: Principal | ICD-10-CM

## 2019-06-13 DIAGNOSIS — R768 Other specified abnormal immunological findings in serum: Principal | ICD-10-CM

## 2019-06-13 DIAGNOSIS — R0602 Shortness of breath: Principal | ICD-10-CM

## 2019-06-26 ENCOUNTER — Encounter: Admit: 2019-06-26 | Discharge: 2019-06-26 | Payer: MEDICARE | Attending: Internal Medicine | Primary: Internal Medicine

## 2019-06-26 ENCOUNTER — Encounter: Admit: 2019-06-26 | Discharge: 2019-06-26 | Payer: MEDICARE

## 2019-06-26 DIAGNOSIS — D6941 Evans syndrome: Principal | ICD-10-CM

## 2019-06-26 DIAGNOSIS — Z79899 Other long term (current) drug therapy: Principal | ICD-10-CM

## 2019-06-26 DIAGNOSIS — D6861 Antiphospholipid syndrome: Principal | ICD-10-CM

## 2019-06-26 DIAGNOSIS — Z5181 Encounter for therapeutic drug level monitoring: Secondary | ICD-10-CM

## 2019-06-26 DIAGNOSIS — M329 Systemic lupus erythematosus, unspecified: Principal | ICD-10-CM

## 2019-07-14 ENCOUNTER — Ambulatory Visit: Admit: 2019-07-14 | Discharge: 2019-07-15 | Disposition: A | Discharge status: Home / Self Care | Payer: MEDICARE

## 2019-07-14 ENCOUNTER — Encounter: Admit: 2019-07-14 | Discharge: 2019-07-15 | Disposition: A | Discharge status: Home / Self Care | Payer: MEDICARE

## 2019-07-14 DIAGNOSIS — B349 Viral infection, unspecified: Principal | ICD-10-CM

## 2019-07-15 ENCOUNTER — Encounter: Admit: 2019-07-15 | Discharge: 2019-07-15 | Payer: MEDICARE

## 2019-07-25 ENCOUNTER — Ambulatory Visit: Admit: 2019-07-25 | Discharge: 2019-07-26 | Payer: MEDICARE

## 2019-08-13 ENCOUNTER — Ambulatory Visit: Admit: 2019-08-13 | Discharge: 2019-08-18 | Disposition: A | Discharge status: Home / Self Care | Payer: MEDICARE

## 2019-08-13 DIAGNOSIS — M329 Systemic lupus erythematosus, unspecified: Secondary | ICD-10-CM | POA: Insufficient documentation

## 2019-08-18 MED ORDER — ACETAMINOPHEN 325 MG TABLET
ORAL_TABLET | Freq: Four times a day (QID) | ORAL | 0 refills | 4 days | PRN
Start: 2019-08-18 — End: ?

## 2019-08-18 MED ORDER — PROCHLORPERAZINE MALEATE 5 MG TABLET
ORAL_TABLET | Freq: Four times a day (QID) | ORAL | 0 refills | 8.00000 days | Status: CP | PRN
Start: 2019-08-18 — End: ?
  Filled 2019-08-18: qty 30, 8d supply, fill #0

## 2019-08-18 MED ORDER — ENOXAPARIN 100 MG/ML SUBCUTANEOUS SYRINGE
Freq: Two times a day (BID) | SUBCUTANEOUS | 0 refills | 7.00000 days | Status: CP
Start: 2019-08-18 — End: 2019-08-25
  Filled 2019-08-18: qty 14, 7d supply, fill #0

## 2019-08-18 MED ORDER — PANTOPRAZOLE 40 MG TABLET,DELAYED RELEASE
ORAL_TABLET | Freq: Two times a day (BID) | ORAL | 0 refills | 30 days | Status: CP
Start: 2019-08-18 — End: 2019-09-17
  Filled 2019-08-18: qty 60, 30d supply, fill #0

## 2019-08-18 MED ORDER — WARFARIN 2 MG TABLET
ORAL_TABLET | ORAL | 0 refills | 28 days | Status: CP
Start: 2019-08-18 — End: ?
  Filled 2019-08-18: qty 106, 28d supply, fill #0

## 2019-08-18 MED FILL — MYCOPHENOLATE MOFETIL 500 MG TABLET: 35 days supply | Qty: 112 | Fill #0 | Status: AC

## 2019-08-18 MED FILL — PROCHLORPERAZINE MALEATE 5 MG TABLET: 8 days supply | Qty: 30 | Fill #0 | Status: AC

## 2019-08-18 MED FILL — PANTOPRAZOLE 40 MG TABLET,DELAYED RELEASE: 30 days supply | Qty: 60 | Fill #0 | Status: AC

## 2019-08-18 MED FILL — ENOXAPARIN 100 MG/ML SUBCUTANEOUS SYRINGE: 7 days supply | Qty: 14 | Fill #0 | Status: AC

## 2019-08-18 MED FILL — WARFARIN 2 MG TABLET: 28 days supply | Qty: 106 | Fill #0 | Status: AC

## 2019-08-18 MED FILL — MYCOPHENOLATE MOFETIL 500 MG TABLET: 35 days supply | Qty: 112 | Fill #0

## 2019-08-22 DIAGNOSIS — R0602 Shortness of breath: Principal | ICD-10-CM

## 2019-08-25 ENCOUNTER — Ambulatory Visit: Admit: 2019-08-25 | Discharge: 2019-08-25 | Payer: MEDICARE | Attending: Hospitalist | Primary: Hospitalist

## 2019-08-25 ENCOUNTER — Encounter: Admit: 2019-08-25 | Discharge: 2019-08-25 | Payer: MEDICARE

## 2019-08-25 DIAGNOSIS — D6941 Evans syndrome: Principal | ICD-10-CM

## 2019-08-25 DIAGNOSIS — R0602 Shortness of breath: Principal | ICD-10-CM

## 2019-08-25 DIAGNOSIS — M329 Systemic lupus erythematosus, unspecified: Principal | ICD-10-CM

## 2019-08-25 MED ORDER — MYCOPHENOLATE MOFETIL 500 MG TABLET
ORAL_TABLET | 1 refills | 0 days | Status: CP
Start: 2019-08-25 — End: 2019-10-29

## 2019-08-28 ENCOUNTER — Encounter: Admit: 2019-08-28 | Discharge: 2019-08-29 | Payer: MEDICARE

## 2019-08-29 DIAGNOSIS — T7840XA Allergy, unspecified, initial encounter: Principal | ICD-10-CM

## 2019-08-29 MED ORDER — ALBUTEROL SULFATE HFA 90 MCG/ACTUATION AEROSOL INHALER
Freq: Four times a day (QID) | RESPIRATORY_TRACT | 3 refills | 0.00000 days | Status: CP | PRN
Start: 2019-08-29 — End: 2020-08-28

## 2019-08-29 MED ORDER — AEROCHAMBER MV SPACER
Freq: Two times a day (BID) | 0 refills | 1 days | Status: CP
Start: 2019-08-29 — End: ?

## 2019-08-29 MED ORDER — FLUTICASONE PROPIONATE 44 MCG/ACTUATION HFA AEROSOL INHALER
Freq: Two times a day (BID) | RESPIRATORY_TRACT | 3 refills | 114 days | Status: CP
Start: 2019-08-29 — End: 2020-08-28

## 2019-08-30 DIAGNOSIS — D7219 Eosinophilic leukocytosis, unspecified type: Principal | ICD-10-CM

## 2019-09-01 ENCOUNTER — Emergency Department
Admit: 2019-09-01 | Discharge: 2019-09-02 | Disposition: A | Discharge status: Home / Self Care | Payer: MEDICARE | Attending: Emergency Medicine

## 2019-09-01 ENCOUNTER — Ambulatory Visit
Admit: 2019-09-01 | Discharge: 2019-09-02 | Disposition: A | Discharge status: Home / Self Care | Payer: MEDICARE | Attending: Emergency Medicine

## 2019-09-01 DIAGNOSIS — D7219 Other eosinophilia: Principal | ICD-10-CM

## 2019-09-08 ENCOUNTER — Ambulatory Visit: Admit: 2019-09-08 | Discharge: 2019-09-21 | Payer: MEDICARE

## 2019-09-10 DIAGNOSIS — D7219 Eosinophilic leukocytosis, unspecified type: Principal | ICD-10-CM

## 2019-09-18 ENCOUNTER — Emergency Department
Admit: 2019-09-18 | Discharge: 2019-09-19 | Disposition: A | Discharge status: Home / Self Care | Payer: MEDICARE | Attending: Emergency Medicine

## 2019-09-18 ENCOUNTER — Encounter
Admit: 2019-09-18 | Discharge: 2019-09-19 | Disposition: A | Discharge status: Home / Self Care | Payer: MEDICARE | Attending: Emergency Medicine

## 2019-09-18 ENCOUNTER — Ambulatory Visit
Admit: 2019-09-18 | Discharge: 2019-09-19 | Disposition: A | Discharge status: Home / Self Care | Payer: MEDICARE | Attending: Emergency Medicine

## 2019-09-18 DIAGNOSIS — D6941 Evans syndrome: Principal | ICD-10-CM

## 2019-09-25 ENCOUNTER — Ambulatory Visit: Admit: 2019-09-25 | Discharge: 2019-09-26 | Payer: MEDICARE | Attending: Internal Medicine | Primary: Internal Medicine

## 2019-09-25 DIAGNOSIS — D6941 Evans syndrome: Principal | ICD-10-CM

## 2019-09-25 DIAGNOSIS — D7219 Eosinophilic leukocytosis, unspecified type: Principal | ICD-10-CM

## 2019-09-25 DIAGNOSIS — D6861 Antiphospholipid syndrome: Principal | ICD-10-CM

## 2019-09-25 DIAGNOSIS — R238 Other skin changes: Principal | ICD-10-CM

## 2019-10-14 ENCOUNTER — Encounter: Admit: 2019-10-14 | Payer: MEDICARE | Attending: Infectious Disease | Primary: Infectious Disease

## 2019-10-15 ENCOUNTER — Ambulatory Visit: Admit: 2019-10-15 | Payer: MEDICARE

## 2019-11-04 ENCOUNTER — Encounter: Admit: 2019-11-04 | Discharge: 2019-11-04 | Discharge status: Against Medical Advice | Payer: MEDICARE

## 2019-11-05 ENCOUNTER — Ambulatory Visit: Admit: 2019-11-05 | Discharge: 2019-11-06 | Payer: MEDICARE

## 2019-11-05 ENCOUNTER — Encounter: Admit: 2019-11-05 | Discharge: 2019-11-06 | Payer: MEDICARE

## 2019-11-05 DIAGNOSIS — Z86718 Personal history of other venous thrombosis and embolism: Secondary | ICD-10-CM | POA: Insufficient documentation

## 2019-11-06 MED ORDER — MYCOPHENOLATE MOFETIL 500 MG TABLET
ORAL_TABLET | Freq: Two times a day (BID) | ORAL | 11 refills | 30.00000 days | Status: CP
Start: 2019-11-06 — End: 2019-11-06

## 2019-11-06 MED ORDER — WARFARIN 5 MG TABLET
ORAL_TABLET | Freq: Every day | ORAL | 0 refills | 30 days | Status: CP
Start: 2019-11-06 — End: 2019-12-06

## 2019-11-06 MED ORDER — MYCOPHENOLATE MOFETIL 500 MG TABLET: tablet | 1 refills | 0 days | Status: AC

## 2019-11-09 DIAGNOSIS — D6941 Evans syndrome: Principal | ICD-10-CM

## 2019-11-09 DIAGNOSIS — M329 Systemic lupus erythematosus, unspecified: Principal | ICD-10-CM

## 2019-11-09 MED ORDER — HYDROXYCHLOROQUINE 200 MG TABLET
ORAL_TABLET | Freq: Two times a day (BID) | ORAL | 0 refills | 45 days | Status: CP
Start: 2019-11-09 — End: ?

## 2019-11-11 ENCOUNTER — Ambulatory Visit: Admit: 2019-11-11 | Discharge: 2019-11-12 | Payer: MEDICARE

## 2019-11-12 ENCOUNTER — Ambulatory Visit: Admit: 2019-11-12 | Discharge: 2019-11-13 | Payer: MEDICARE

## 2019-11-12 DIAGNOSIS — R238 Other skin changes: Principal | ICD-10-CM

## 2019-11-12 MED ORDER — CLOBETASOL 0.05 % TOPICAL OINTMENT
Freq: Two times a day (BID) | TOPICAL | 0 refills | 0.00000 days | Status: CP
Start: 2019-11-12 — End: 2020-11-11

## 2019-11-19 ENCOUNTER — Encounter: Admit: 2019-11-19 | Discharge: 2019-11-20 | Payer: MEDICARE | Attending: Dermatology | Primary: Dermatology

## 2019-11-19 DIAGNOSIS — L309 Dermatitis, unspecified: Principal | ICD-10-CM

## 2019-11-19 DIAGNOSIS — L932 Other local lupus erythematosus: Principal | ICD-10-CM

## 2019-11-19 DIAGNOSIS — B078 Other viral warts: Principal | ICD-10-CM

## 2019-11-19 DIAGNOSIS — B353 Tinea pedis: Principal | ICD-10-CM

## 2019-11-29 ENCOUNTER — Encounter: Admit: 2019-11-29 | Discharge: 2019-11-29 | Discharge status: Against Medical Advice | Payer: MEDICARE

## 2019-12-01 ENCOUNTER — Ambulatory Visit: Admit: 2019-12-01 | Discharge: 2019-12-01 | Payer: MEDICARE

## 2019-12-01 ENCOUNTER — Encounter: Admit: 2019-12-01 | Discharge: 2019-12-01 | Payer: MEDICARE

## 2019-12-01 ENCOUNTER — Ambulatory Visit: Admit: 2019-12-01 | Discharge: 2019-12-01 | Payer: MEDICARE | Attending: Internal Medicine | Primary: Internal Medicine

## 2019-12-01 DIAGNOSIS — D7219 Peripheral eosinophilia: Principal | ICD-10-CM

## 2019-12-01 DIAGNOSIS — N920 Excessive and frequent menstruation with regular cycle: Principal | ICD-10-CM

## 2019-12-01 DIAGNOSIS — M329 Systemic lupus erythematosus, unspecified: Principal | ICD-10-CM

## 2019-12-01 DIAGNOSIS — J310 Chronic rhinitis: Principal | ICD-10-CM

## 2019-12-01 DIAGNOSIS — D6941 Evans syndrome: Principal | ICD-10-CM

## 2019-12-01 DIAGNOSIS — M328 Other forms of systemic lupus erythematosus: Principal | ICD-10-CM

## 2019-12-01 MED ORDER — HYDROXYCHLOROQUINE 200 MG TABLET
ORAL_TABLET | Freq: Two times a day (BID) | ORAL | 0 refills | 45.00000 days | Status: CP
Start: 2019-12-01 — End: ?

## 2019-12-01 MED ORDER — MYCOPHENOLATE MOFETIL 500 MG TABLET
ORAL_TABLET | 1 refills | 0 days | Status: CP
Start: 2019-12-01 — End: 2020-02-04

## 2019-12-18 DIAGNOSIS — D509 Iron deficiency anemia, unspecified: Secondary | ICD-10-CM | POA: Insufficient documentation

## 2019-12-23 ENCOUNTER — Ambulatory Visit: Admit: 2019-12-23 | Discharge: 2019-12-24 | Payer: MEDICARE | Attending: Dermatology | Primary: Dermatology

## 2019-12-23 DIAGNOSIS — R21 Rash and other nonspecific skin eruption: Principal | ICD-10-CM

## 2019-12-24 ENCOUNTER — Ambulatory Visit: Admit: 2019-12-24 | Discharge: 2019-12-26 | Disposition: A | Discharge status: Home / Self Care | Payer: MEDICARE

## 2019-12-24 ENCOUNTER — Encounter: Admit: 2019-12-24 | Discharge: 2019-12-26 | Disposition: A | Discharge status: Home / Self Care | Payer: MEDICARE

## 2019-12-26 MED ORDER — ENOXAPARIN 100 MG/ML SUBCUTANEOUS SYRINGE
Freq: Two times a day (BID) | SUBCUTANEOUS | 0 refills | 7 days | Status: CP
Start: 2019-12-26 — End: 2020-01-02
  Filled 2019-12-26: qty 14, 7d supply, fill #0

## 2019-12-26 MED ORDER — IBUPROFEN 600 MG TABLET
ORAL_TABLET | Freq: Three times a day (TID) | ORAL | 0 refills | 13 days | Status: CP
Start: 2019-12-26 — End: 2020-01-08
  Filled 2019-12-26: qty 39, 13d supply, fill #0

## 2019-12-26 MED ORDER — WARFARIN 6 MG TABLET
ORAL_TABLET | ORAL | 0 refills | 28 days | Status: CP
Start: 2019-12-26 — End: 2020-01-25
  Filled 2019-12-26: qty 12, 28d supply, fill #0

## 2019-12-26 MED ORDER — COLCHICINE 0.6 MG CAPSULE
ORAL_CAPSULE | Freq: Two times a day (BID) | ORAL | 0 refills | 0.00000 days | Status: CP
Start: 2019-12-26 — End: 2020-03-25
  Filled 2019-12-26: qty 120, 60d supply, fill #0

## 2019-12-26 MED FILL — WARFARIN 6 MG TABLET: 28 days supply | Qty: 12 | Fill #0 | Status: AC

## 2019-12-26 MED FILL — IBUPROFEN 600 MG TABLET: 13 days supply | Qty: 39 | Fill #0 | Status: AC

## 2019-12-26 MED FILL — MITIGARE 0.6 MG CAPSULE: 60 days supply | Qty: 120 | Fill #0 | Status: AC

## 2019-12-26 MED FILL — ENOXAPARIN 100 MG/ML SUBCUTANEOUS SYRINGE: 7 days supply | Qty: 14 | Fill #0 | Status: AC

## 2019-12-27 MED ORDER — WARFARIN 5 MG TABLET
ORAL_TABLET | ORAL | 0 refills | 28 days | Status: CP
Start: 2019-12-27 — End: 2020-01-26

## 2019-12-29 ENCOUNTER — Ambulatory Visit: Admit: 2019-12-29 | Discharge: 2019-12-30 | Payer: MEDICARE

## 2020-01-02 ENCOUNTER — Ambulatory Visit: Admit: 2020-01-02 | Discharge: 2020-01-02 | Payer: MEDICARE

## 2020-01-02 ENCOUNTER — Ambulatory Visit: Admit: 2020-01-02 | Discharge: 2020-01-02 | Payer: MEDICARE | Attending: Adult Health | Primary: Adult Health

## 2020-01-02 DIAGNOSIS — D6941 Evans syndrome: Principal | ICD-10-CM

## 2020-01-02 DIAGNOSIS — D6861 Antiphospholipid syndrome: Principal | ICD-10-CM

## 2020-01-15 ENCOUNTER — Ambulatory Visit: Admit: 2020-01-15 | Discharge: 2020-01-16 | Payer: MEDICARE | Attending: Internal Medicine | Primary: Internal Medicine

## 2020-01-15 DIAGNOSIS — D6861 Antiphospholipid syndrome: Principal | ICD-10-CM

## 2020-01-15 DIAGNOSIS — D500 Iron deficiency anemia secondary to blood loss (chronic): Principal | ICD-10-CM

## 2020-01-15 DIAGNOSIS — D6941 Evans syndrome: Principal | ICD-10-CM

## 2020-02-05 ENCOUNTER — Ambulatory Visit: Admit: 2020-02-05 | Discharge: 2020-02-06 | Payer: MEDICARE

## 2020-02-05 DIAGNOSIS — M329 Systemic lupus erythematosus, unspecified: Principal | ICD-10-CM

## 2020-02-05 DIAGNOSIS — M328 Other forms of systemic lupus erythematosus: Principal | ICD-10-CM

## 2020-02-05 DIAGNOSIS — D6941 Evans syndrome: Principal | ICD-10-CM

## 2020-02-06 MED ORDER — HYDROXYCHLOROQUINE 200 MG TABLET
ORAL_TABLET | Freq: Two times a day (BID) | ORAL | 0 refills | 45.00000 days | Status: CP
Start: 2020-02-06 — End: ?

## 2020-02-06 MED ORDER — MYCOPHENOLATE MOFETIL 500 MG TABLET
ORAL_TABLET | 1 refills | 0 days | Status: CP
Start: 2020-02-06 — End: 2020-04-10

## 2020-03-01 ENCOUNTER — Ambulatory Visit: Admit: 2020-03-01 | Discharge: 2020-03-02 | Payer: MEDICARE

## 2020-03-01 DIAGNOSIS — D6861 Antiphospholipid syndrome: Principal | ICD-10-CM

## 2020-03-01 DIAGNOSIS — D6941 Evans syndrome: Principal | ICD-10-CM

## 2020-03-01 DIAGNOSIS — D590 Drug-induced autoimmune hemolytic anemia: Principal | ICD-10-CM

## 2020-03-03 ENCOUNTER — Ambulatory Visit: Admit: 2020-03-03 | Discharge: 2020-03-04 | Payer: MEDICARE | Attending: Dermatology | Primary: Dermatology

## 2020-03-03 DIAGNOSIS — L309 Dermatitis, unspecified: Principal | ICD-10-CM

## 2020-03-03 DIAGNOSIS — B079 Viral wart, unspecified: Principal | ICD-10-CM

## 2020-03-03 MED ORDER — CLOBETASOL 0.05 % TOPICAL CREAM
Freq: Two times a day (BID) | TOPICAL | 1 refills | 0.00000 days | Status: CP
Start: 2020-03-03 — End: 2021-03-03

## 2020-03-03 MED ORDER — CLOBETASOL 0.05 % TOPICAL OINTMENT
Freq: Two times a day (BID) | TOPICAL | 1 refills | 0.00000 days | Status: CP
Start: 2020-03-03 — End: 2021-03-03

## 2020-03-03 MED ORDER — FLUOROURACIL 5 % TOPICAL CREAM
2 refills | 0.00000 days | Status: CP
Start: 2020-03-03 — End: ?

## 2020-03-17 ENCOUNTER — Ambulatory Visit: Admit: 2020-03-17 | Discharge: 2020-03-18 | Payer: MEDICARE

## 2020-03-17 DIAGNOSIS — M328 Other forms of systemic lupus erythematosus: Principal | ICD-10-CM

## 2020-03-17 DIAGNOSIS — D6941 Evans syndrome: Principal | ICD-10-CM

## 2020-03-17 DIAGNOSIS — M329 Systemic lupus erythematosus, unspecified: Principal | ICD-10-CM

## 2020-03-17 MED ORDER — MYCOPHENOLATE MOFETIL 500 MG TABLET
ORAL_TABLET | Freq: Two times a day (BID) | ORAL | 3 refills | 30.00000 days | Status: CP
Start: 2020-03-17 — End: 2020-04-16
  Filled 2020-03-22: qty 120, 30d supply, fill #0

## 2020-03-17 MED ORDER — HYDROXYCHLOROQUINE 200 MG TABLET
ORAL_TABLET | Freq: Every day | ORAL | 3 refills | 90.00000 days | Status: CP
Start: 2020-03-17 — End: 2020-06-20
  Filled 2020-03-22: qty 180, 90d supply, fill #0

## 2020-03-18 DIAGNOSIS — D6861 Antiphospholipid syndrome: Principal | ICD-10-CM

## 2020-03-18 DIAGNOSIS — D590 Drug-induced autoimmune hemolytic anemia: Principal | ICD-10-CM

## 2020-03-18 DIAGNOSIS — D6941 Evans syndrome: Principal | ICD-10-CM

## 2020-03-18 NOTE — Unmapped (Signed)
[facility] Specialty Medication Onboarding    Specialty Medication: Mycophenolate 500mg  capsules  Prior Authorization: Not Required   Financial Assistance: No - copay  <$25  Final Copay/Day Supply: $1.30 / 30 days    Insurance Restrictions: Yes - max 1 month supply     Notes to Pharmacist:     The triage team has completed the benefits investigation and has determined that the patient is able to fill this medication at [facility]. Please contact the patient to complete the onboarding or follow up with the prescribing physician as needed.

## 2020-03-19 NOTE — Unmapped (Addendum)
I reviewed this patient case and all documentation provided by the learner and was readily available for consultation during their interaction with the patient.  I agree with the assessment and plan listed below.    Lupita Shutter, PharmD, BCPS  [facility] Specialty Pharmacist      [facility]   Patient Onboarding/Medication Counseling    Melody Padilla is a 24 y.o. female with systemic lupus erythematosus who I am counseling today on re-initiation of therapy.  I am speaking to the patient.    Was a Nurse, learning disability used for this call? No    Verified patient's date of birth / HIPAA.    Specialty medication(s) to be sent: Inflammatory Disorders: mycophenolate      Non-specialty medications/supplies to be sent: hydroxychloroquine         Cellcept (mycopheonlate mofetil)    The patient declined counseling on medication administration, missed dose instructions, goals of therapy, side effects and monitoring parameters, warnings and precautions, drug/food interactions and storage, handling precautions, and disposal because they have taken the medication previously. The information in the declined sections below are for informational purposes only and was not discussed with patient.     Medication & Administration     Dosage: Take 2 tablets (1000MG ) by mouth two times daily    Administration: Take by mouth with or without food. Taking with food can minimize GI side effects. Swallow capsules whole, do not crush or chew.    Adherence/Missed dose instructions:  Take a missed dose as soon as you remember it . If it is close to the time of your next dose, skip the missed dose and resume your normal schedule.Never take 2 doses to try and catch up from a missed dose.    Goals of Therapy     Prevent disease progression    Side Effects & Monitoring Parameters     ??? Feeling tired or weak  ??? Shakiness  ??? Trouble sleeping  ??? Diarrhea, abdominal pain, nausea, vomiting, constipation or decreased appetite  ??? Decreases in blood counts   ??? Back or joint pain  ??? Hypertension or hypotension  ??? High blood sugar  ??? Headache  ??? Skin rash    The following side effects should be reported to the provider:  ??? Reduced immune function ??? report signs of infection such as fever; chills; body aches; very bad sore throat; ear or sinus pain; cough; more sputum or change in color of sputum; pain with passing urine; wound that will not heal, etc.  Also at a slightly higher risk of some malignancies (mainly skin and blood cancers) due to this reduced immune function.  ??? Allergic reaction (rash, hives, swelling, shortness of breath)  ??? High blood sugar (confusion, feeling sleepy, more thirst, more hungry, passing urine more often, flushing, fast breathing, or breath that smells like fruit)  ??? Electrolyte issues (mood changes, confusion, muscle pain or weakness, a heartbeat that does not feel normal, seizures, not hungry, or very bad upset stomach or throwing up)  ??? High or low blood pressure (bad headache or dizziness, passing out, or change in eyesight)  ??? Kidney issues (unable to pass urine, change in how much urine is passed, blood in the urine, or a big weight gain)  ??? Skin (oozing, heat, swelling, redness, or pain), UTI and other infections   ??? Chest pain or pressure  ??? Abnormal heartbeat  ??? Unexplained bleeding or bruising  ??? Abnormal burning, numbness, or  tingling  ??? Muscle cramps,  ??? Yellowing of skin or eyes    Monitoring parameters  ??? Pregnancy   ??? CBC   ??? Renal and hepatic function    Contraindications, Warnings, & Precautions     ??? *This is a REMS drug and an FDA-approved patient medication guide will be printed with each dispensation  ??? Black Box Warning: Infections   ??? Black Box Warning: Lymphoproliferative disorders - risk of development of lymphoma and skin malignancy is increased  ??? Black Box Warning: Use during pregnancy is associated with increased risks of first trimester pregnancy loss and congenital malformations.   ??? Black Box Warning: Females of reproductive potential should use contraception during treatment and for 6 weeks after therapy is discontinued  ??? CNS depression  ??? New or reactivated viral infections  ??? Neutropenia  ??? Female patients and/or their female partners should use effective contraception during treatment of the female patient and for at least 3 months after last dose.  ??? Breastfeeding is not recommended during therapy and for 6 weeks after last dose    Drug/Food Interactions     ??? Medication list reviewed in Epic. The patient was instructed to inform the care team before taking any new medications or supplements. No drug interactions identified.   ??? Separate doses of antacids and this medication  ??? Check with your doctor before getting any vaccinations    Storage, Handling Precautions, & Disposal     ??? Store at room temperature in a dry place  ??? This medication is considered hazardous. Wash hands after handling and store out of reach or others, including children and pets.      Current Medications (including OTC/herbals), Comorbidities and Allergies     Current Outpatient Medications   Medication Sig Dispense Refill   ??? acetaminophen (TYLENOL) 325 MG tablet Take 2 tablets (650 mg total) by mouth every six (6) hours as needed. 30 tablet 0   ??? albuterol HFA 90 mcg/actuation inhaler Inhale 2 puffs every six (6) hours as needed for wheezing or shortness of breath. 8 g 3   ??? clobetasoL (TEMOVATE) 0.05 % cream Apply topically Two (2) times a day. 60 g 1   ??? clobetasoL (TEMOVATE) 0.05 % ointment Apply topically Two (2) times a day. 60 g 1   ??? colchicine (MITIGARE) 0.6 mg cap capsule Take 1 capsule (0.6 mg total) by mouth Two (2) times a day. 180 capsule 0   ??? famotidine (PEPCID) 20 MG tablet Take 20 mg by mouth Two (2) times a day.     ??? fluorouraciL (EFUDEX) 5 % cream Apply nightly to warts 40 g 2   ??? hydrOXYchloroQUINE (PLAQUENIL) 200 mg tablet Take 2 tablets (400 mg total) by mouth daily. 180 tablet 3   ??? mycophenolate (CELLCEPT) 500 mg tablet Take 2 tablets (1,000 mg total) by mouth Two (2) times a day. Take 120 tablet 3   ??? sertraline HCl (SERTRALINE ORAL) Take 150 mg by mouth daily.      ??? warfarin (COUMADIN) 2 MG tablet Take 2 mg by mouth daily with evening meal.     ??? warfarin (COUMADIN) 5 MG tablet Take 5 mg by mouth daily with evening meal.       Current Facility-Administered Medications   Medication Dose Route Frequency Provider Last Rate Last Admin   ??? levonorgestrel (MIRENA) 20 mcg/24 hr (5 years) IUD 1 kit  1 each Intrauterine Once Sydnee Levans, MD  Allergies   Allergen Reactions   ??? Oxycodone Shortness Of Breath, Itching and Rash   ??? Mistletoe (Viscum Jordan - From Apple Trees)      Allergic to All Types of tree's       Patient Active Problem List   Diagnosis   ??? Thrombocytopenia (CMS-HCC)   ??? Migraine   ??? Seasonal allergies   ??? Evans syndrome (CMS-HCC)   ??? Class 2 obesity in adult   ??? Positive ANA (antinuclear antibody)   ??? Contraception management   ??? Depression   ??? Eczema   ??? Obstructive sleep apnea syndrome in adult   ??? Pulmonary emboli (CMS-HCC)   ??? Acute deep vein thrombosis (DVT) of femoral vein of right lower extremity (CMS-HCC)   ??? Acute deep vein thrombosis (DVT) of popliteal vein of right lower extremity (CMS-HCC)   ??? Antiphospholipid antibody syndrome (CMS-HCC)   ??? Cholelithiasis   ??? DVT history on anticoagulation    ??? Epigastric pain   ??? Steatosis of liver   ??? Gastroesophageal reflux disease   ??? Infection of skin   ??? Hemolytic anemia (CMS-HCC)   ??? Jaundice   ??? Hyperbilirubinemia   ??? Long-term use of Plaquenil   ??? Emmetropia   ??? Dry eye syndrome of bilateral lacrimal glands   ??? Floaters with photopsia   ??? 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 BMI 36.0-36.9,adult   ??? Evan's syndrome (CMS-HCC)   ??? SLE (systemic lupus erythematosus) (CMS-HCC)   ??? Nausea, vomiting, and diarrhea   ??? Norovirus   ??? History of venous thromboembolism   ??? Psoriasis   ??? Other chest pain   ??? Iron deficiency anemia due to chronic blood loss       Reviewed and up to date in Epic.    Appropriateness of Therapy     Is medication and dose appropriate based on diagnosis? Yes    Prescription has been clinically reviewed: Yes    Baseline Quality of Life Assessment      Rheumatology:   Quality of Life    On a scale of 1 ??? 10 with 1 representing not at all and 10 representing completely ??? how has your rheumatologic condition affected your:  Daily pain level?: decline to answer  Ability to complete your regular daily tasks (prepare meals, get dressed, etc.)?: decline to answer  Ability to participate in social or family activities?: decline to answer         Financial Information     Medication Assistance provided: None Required    Anticipated copay of $1.30 reviewed with patient. Verified delivery address.    Delivery Information     Scheduled delivery date: 03/23/2020    Expected start date: 03/24/2020    Medication will be delivered via Next Day Courier to the prescription address in [facility].  This shipment will not require a signature.      Explained the services we provide at [facility] and that each month we would call to set up refills.  Stressed importance of returning phone calls so that we could ensure they receive their medications in time each month.  Informed patient that we should be setting up refills 7-10 days prior to when they will run out of medication.  A pharmacist will reach out to perform a clinical assessment periodically.  Informed patient that a welcome packet and a drug information handout will be sent.      Patient verbalized understanding of the above  information as well as how to contact the pharmacy at [phone] option 4 with any questions/concerns.  The pharmacy is open Monday through Friday 8:30am-4:30pm.  A pharmacist is available 24/7 via pager to answer any clinical questions they may have.    Patient Specific Needs - Does the patient have any physical, cognitive, or cultural barriers? No    - Patient prefers to have medications discussed with  Patient     - Is the patient or caregiver able to read and understand education materials at a high school level or above? Yes    - Patient's primary language is  English     - Is the patient high risk? Yes, patient is taking a REMS drug. Medication is dispensed in compliance with REMS program    - Does the patient require a Care Management Plan? No     - Does the patient require physician intervention or other additional services (i.e. nutrition, smoking cessation, social work)? No        Karene Fry. Sinodis, PharmD, BCPS  Clinical Pharmacist - [facility]  [address], [place], [place] [address]  Phone: [phone] - Fax. [phone]

## 2020-03-22 MED FILL — HYDROXYCHLOROQUINE 200 MG TABLET: 90 days supply | Qty: 180 | Fill #0 | Status: AC

## 2020-03-22 MED FILL — MYCOPHENOLATE MOFETIL 500 MG TABLET: 30 days supply | Qty: 120 | Fill #0 | Status: AC

## 2020-03-23 LAB — ANTI-DNA ANTIBODY, DOUBLE-STRANDED
DSDNA AB TITER: 1:160 {titer}
DSDNA ANTIBODY: POSITIVE — AB

## 2020-04-04 DIAGNOSIS — D6941 Evans syndrome: Principal | ICD-10-CM

## 2020-04-04 DIAGNOSIS — D6861 Antiphospholipid syndrome: Principal | ICD-10-CM

## 2020-04-04 DIAGNOSIS — D590 Drug-induced autoimmune hemolytic anemia: Principal | ICD-10-CM

## 2020-04-13 NOTE — Unmapped (Deleted)
Dermatology Note     Assessment and Plan:      Eczema on the hands, chronic, not at goal but improved compared to prior photo  - Discussed biopsy results consistent with eczema  - Difficulty with medication adherence with the ointment due to the feel  -*** clobetasol 0.05% cream. Apply topically to active ares of rash twice daily until smooth. Recommend occlusion at night.  - Recommend fragrance free creams to moisturize hands after washing hands (Cetaphil cream, CeraVe, or Vanicream)  ??    Verruca vulgaris??on R 3rd finger  - Informed patient that multiple treatments will be necessary, study shows an average of roughly 5 treatments  - *** compound W daily  - *** 5-fluorouracil nightly  - Jointly elect for repeat (***) cryotherapy today, see procedure details below:    Cryotherapy Procedure Note:   After R/B/A discussed (including scarring, pigment alteration, recurrence, or persistence of the lesion) and verbal consent was obtained, identified lesions were treated with liquid nitrogen x 1 ten second freeze-thaw cycle. The patient tolerated the procedure well and was instructed on post-procedure care.  Total verruca frozen: ***  Location: right 3rd finger  ??    Hx of cutaneous lupus- ***  -??Stable hyperpigmented patch on L forearm, patient has clobetasol which she can use if it becomes active symptomatic  - Previously biopsied patch - consistent with lupus  - Treats lupus with Cellcept and plaquenil      There are no diagnoses linked to this encounter.    The patient was advised to call for an appointment should any new, changing, or symptomatic lesions develop.     RTC: No follow-ups on file. or sooner as needed   _________________________________________________________________      Chief Complaint     Chief Complaint   Patient presents with   ??? Eczema     F/u rash on neck itchy     HPI     Melody Padilla is a 24 y.o. female who presents as a returning patient (last seen by Dr. Marvis Moeller on Dr. Francia Greaves on 03/03/2020) to [facility] for follow up of eczema. *** At last visit, the patient switched from clobetasol 0.05% ointment to cream for eczema. For a wart, she was started on Efudex 5% cream and received cryotherapy.    Today she reports *** of her eczema while ***    Patient states that the wart on her right middle finger is ***    The patient denies any other new or changing lesions or areas of concern.     Pertinent Past Medical History     No history of skin cancer  Lupus w/ hx of cutaneous involvement on arm, managed by??rheumatology -??on plaquenil, cellcept  and rituximab  APLS   Evans syndrome     Family History:   Negative for melanoma    Past Medical History, Family History, Social History, Medication List, Allergies, and Problem List were reviewed in the rooming section of Epic.     ROS: Other than symptoms mentioned in the HPI, no fevers, chills, or other skin complaints    Physical Examination     GENERAL: Well-appearing female in no acute distress, resting comfortably.  NEURO: Alert and oriented, answers questions appropriately  PSYCH: Normal mood and affect  {PE extent:75514}  {PE list:75421}  - ***    All areas not commented on are within normal limits or unremarkable    Scribe's Attestation: Gabriel Carina, MD obtained and performed the history, physical  exam and medical decision making elements that were entered into the chart.  Signed by Mable Fill, Scribe, on ***.    {*** NOTE TO PROVIDER: PLEASE ADD ATTESTATION NOTING YOU AGREE WITH SCRIBE DOCUMENTATION}     (Approved Template 01/12/2020)

## 2020-04-14 ENCOUNTER — Ambulatory Visit: Admit: 2020-04-14 | Discharge: 2020-04-14 | Disposition: A | Discharge status: Home / Self Care | Payer: MEDICARE

## 2020-04-14 DIAGNOSIS — Z86718 Personal history of other venous thrombosis and embolism: Principal | ICD-10-CM

## 2020-04-14 DIAGNOSIS — F419 Anxiety disorder, unspecified: Principal | ICD-10-CM

## 2020-04-14 DIAGNOSIS — M329 Systemic lupus erythematosus, unspecified: Principal | ICD-10-CM

## 2020-04-14 DIAGNOSIS — Z86711 Personal history of pulmonary embolism: Principal | ICD-10-CM

## 2020-04-14 DIAGNOSIS — L600 Ingrowing nail: Principal | ICD-10-CM

## 2020-04-14 DIAGNOSIS — E119 Type 2 diabetes mellitus without complications: Principal | ICD-10-CM

## 2020-04-14 DIAGNOSIS — Z6839 Body mass index (BMI) 39.0-39.9, adult: Principal | ICD-10-CM

## 2020-04-14 DIAGNOSIS — S90412A Abrasion, left great toe, initial encounter: Principal | ICD-10-CM

## 2020-04-14 DIAGNOSIS — L309 Dermatitis, unspecified: Principal | ICD-10-CM

## 2020-04-14 DIAGNOSIS — Z7901 Long term (current) use of anticoagulants: Principal | ICD-10-CM

## 2020-04-14 DIAGNOSIS — R791 Abnormal coagulation profile: Principal | ICD-10-CM

## 2020-04-14 DIAGNOSIS — Z885 Allergy status to narcotic agent status: Principal | ICD-10-CM

## 2020-04-14 DIAGNOSIS — Z7951 Long term (current) use of inhaled steroids: Principal | ICD-10-CM

## 2020-04-14 DIAGNOSIS — M79675 Pain in left toe(s): Principal | ICD-10-CM

## 2020-04-14 DIAGNOSIS — L409 Psoriasis, unspecified: Principal | ICD-10-CM

## 2020-04-14 DIAGNOSIS — Z79899 Other long term (current) drug therapy: Principal | ICD-10-CM

## 2020-04-14 DIAGNOSIS — E669 Obesity, unspecified: Principal | ICD-10-CM

## 2020-04-14 LAB — CBC W/ AUTO DIFF
BASOPHILS ABSOLUTE COUNT: 0 10*9/L (ref 0.0–0.1)
BASOPHILS RELATIVE PERCENT: 0.3 %
EOSINOPHILS ABSOLUTE COUNT: 0.3 10*9/L (ref 0.0–0.4)
EOSINOPHILS RELATIVE PERCENT: 7.4 %
HEMATOCRIT: 37.2 % (ref 36.0–46.0)
HEMOGLOBIN: 12.1 g/dL (ref 12.0–16.0)
LARGE UNSTAINED CELLS: 3 % (ref 0–4)
LYMPHOCYTES ABSOLUTE COUNT: 0.6 10*9/L — ABNORMAL LOW (ref 1.5–5.0)
LYMPHOCYTES RELATIVE PERCENT: 15.9 %
MEAN CORPUSCULAR HEMOGLOBIN CONC: 32.4 g/dL (ref 31.0–37.0)
MEAN CORPUSCULAR HEMOGLOBIN: 24.6 pg — ABNORMAL LOW (ref 26.0–34.0)
MEAN CORPUSCULAR VOLUME: 76.1 fL — ABNORMAL LOW (ref 80.0–100.0)
MEAN PLATELET VOLUME: 10.9 fL — ABNORMAL HIGH (ref 7.0–10.0)
MONOCYTES ABSOLUTE COUNT: 0.4 10*9/L (ref 0.2–0.8)
MONOCYTES RELATIVE PERCENT: 9.7 %
NEUTROPHILS ABSOLUTE COUNT: 2.5 10*9/L (ref 2.0–7.5)
NEUTROPHILS RELATIVE PERCENT: 63.7 %
PLATELET COUNT: 268 10*9/L (ref 150–440)
RED BLOOD CELL COUNT: 4.9 10*12/L (ref 4.00–5.20)
RED CELL DISTRIBUTION WIDTH: 16.8 % — ABNORMAL HIGH (ref 12.0–15.0)
WBC ADJUSTED: 4 10*9/L — ABNORMAL LOW (ref 4.5–11.0)

## 2020-04-14 LAB — PROTIME-INR
INR: 6.45
PROTIME: 74.1 s (ref 10.3–13.4)

## 2020-04-14 NOTE — Unmapped (Signed)
Patient reports she has had an intermittent bump that pops and goes away on the nailbed of the R great toe. Patient hit said bump, but does not know how this happened. Toe won't stop bleeding.

## 2020-04-15 ENCOUNTER — Ambulatory Visit: Admit: 2020-04-15 | Discharge: 2020-04-16 | Payer: MEDICARE | Attending: Internal Medicine | Primary: Internal Medicine

## 2020-04-15 DIAGNOSIS — D6941 Evans syndrome: Principal | ICD-10-CM

## 2020-04-15 DIAGNOSIS — D696 Thrombocytopenia, unspecified: Principal | ICD-10-CM

## 2020-04-15 DIAGNOSIS — D6861 Antiphospholipid syndrome: Principal | ICD-10-CM

## 2020-04-15 LAB — SLIDE REVIEW

## 2020-04-15 NOTE — Unmapped (Signed)
Will proceed with ferrlicit infusion given the reaction to infed in October.   Platelets were normal yesterday.   Follow-up in 3 months.

## 2020-04-15 NOTE — Unmapped (Signed)
This encounter was created in error - please disregard.

## 2020-04-15 NOTE — Unmapped (Signed)
Hematology Clinic Visit    PCP: [facility]     Reason for visit: Follow-up Melody Padilla    Assessment and Recommendations:  [name] is a 24 y.o. female with Melody Padilla who presents for follow-up.     1. Iron deficiency anemia:  Likely due to heavy menstrual bleeding and was recently hospitalized for the same.   Continues to have microcytic anemia.   Missed last period, just some spotting - has mirena - placed in June 2021.   Will set up iron infusion but switch to ferrlicit given the reaction to Infed in October.     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    Has experienced multiple flares over the last 2 years.    (i) She was hospitalized 12/2017 with WAIHA flare.  Her platelets on admission were wnl. She received methylprednisolone 1 g x 3 doses (01/27/18 - 01/29/18), Rituximab 375 mg/m^2 (x4 01/27/18-02/25/18). She had a prolonged steroid taper and was started on Azathioprine as a steroid sparing agent by Rheumatology.   (ii) Melody flares in October and November 2020 treated with 4 days of IVIG, 4 days of high-dose dexamethasone each time, and 1 g rituximab x2 doses (completed 02/27/2019).  (iii) Her disease is not dominated by ITP flares, most recent being in April of this year, treated with IVIG, steroids, and rituximab. Platelet count has been normal for the last month. This could also be helped by switching out Azathioprine for Cellcept.     Doing well from the Melody syndrome standpoint.     3. 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   - Asked to hold 3 doses. She will connect with her PCP.     4. SLE  Primary manifestations include joint pains.  She follows with [facility] (Dr. Ilsa Iha).  She is currently on Plaquenil and Cellcept.     Will schedule follow up visit with hematology in 3 months      -------------------------------    History of Present Illness:  Melody Padilla is a 24 y.o. Hispanic woman with SLE, triple positive APLAS (on warfarin) and Evans syndrome (WAIHA + ITP) who is being seen at the hematology clinic today for follow-up.  I last saw Melody Padilla in clinic in September 2021. Kindly refer to my clinic note for full details.     Background history:  Melody Padilla was hospitalized at [facility] from June 13-21, 2018.  She was admitted for evaluation of right lower extremity and groin pain and found to have ileofemoral DVT and pulmonary embolism.  She was initiated on anticoagulation and has remained on this following confirmation of the diagnosis of antiphospholipid syndrome.  ??  Melody Padilla was subsequently hospitalized from 01/25/18 - 01/29/18 with WAIHA flare.  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  Her platelets were appropriate at that time.  She received IV methylpred for 3 days from 9/29 - 10/1.  She also started on Rituximab 9/29 with plan for weekly infusion.  Her Hgb on day of discharge was 9.2.  Bilirubin trended down to 3.1, and LDH to 1002 on day of discharge.   ??  At the time of her follow-up visit  with me in June 2020, Melody Padilla reported being well since discharge from the hospital.  She was started on Azathiprine by Dr.Snyder. She was tolerating this well. She was evaluated at a local ED in April 2020 for left sided non-pleuritic chest pain and reportedly had a negative cardiac and PE work up (we do not have the results).  3 weeks earlier she noted increasing fatigue, lightheadedness and occasional bruising that was more excessive than what she has had with her INR being therapeutic. She was not using NSAIDs and no over the counter medications and apart from a recent 4 day period where she ran out of her medications, she was very compliant.  I recommended continuing the current treatment plan at that time.     Ms. Brandan Glauber was found to have a flare of Melody syndrome in the later part of September 2020.  Platelets progressively decreased and were 30,000 by February 03, 2019.  I advised her to hold anticoagulation and started her on prednisone 40 mg daily but her thrombocytopenia continue to progress and she was hospitalized at [facility] from 10/12-10/24/2020 for management.  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  She was treated with a 4-day course of IVIG, 4 days of high-dose dexamethasone, and dose number 1 out of 2 of 1 g rituximab (02/11/2019).  Platelets 117,000 on discharge.  Anticoagulation was resumed with warfarin on Lovenox bridge.  ??  At the time of her visit with me in early November 2020, Melody Padilla was continuing to bridge with Lovenox while awaiting therapeutic INR on warfarin.  Given extensive bruising over her abdominal wall from the Lovenox injections, I have recommended that she stop the Lovenox and just take the warfarin.  She reported continue to feel fatigued and difficulty with insomnia.  She was not discharged on any steroids.  She had the second dose of rituximab at the hematology clinic on 02/27/2019.  CBC at that time showed normal hemoglobin, platelets of 96,000.  INR was 2.29.  I recommended continuing weekly CBCs, which she typically had drawn at the [facility] clinic.  ??  Unfortunately, Melody Padilla was again hospitalized from November 21 to March 25, 2019 for flare of ITP.  She developed epistaxis x3 and some gingival bleeding and labs revealed a platelet count of 9.  She [facility] to [facility] ER and was hospitalized.  She was treated with steroids and IVIG. Home azathioprine and plaquenil were continued throughout admission per rheumatology.  She was discharged on a 4-week taper of prednisone starting at 40 mg once daily.  Platelet count on day of discharge is 103,000.  She was discharged on Metformin with instructions to take it as long as she was taking prednisone.  This was due to elevated blood sugars observed during the hospitalization while on steroids.  Amily reports doing well since discharge from the bleeding standpoint.  This week is the last of her prednisone taper.  She is understandably concerned about whether she would relapse now that she is coming off steroids.  She has gained about 10 pounds over the last 2 months from repeated courses of steroids.  She remains on warfarin.  She has not had any labs in almost 2 weeks.  Last platelet count was greater than 200,000 and INR was therapeutic.  She continues to have some shortness of breath with ADLs and minimal exertion.  This has been ongoing for a number  of months now.      Ms. Kada Friesen was sick with COVID-19 in June.     She was hospitalized from 7/6-11/06/2019 with heavy menstrual bleeding. Hgb was at baseline 12.2 with normal platelets. INR was 6.95 and she was administered vitamin K and discharged.    She saw ID on 11/12/2019 & dermatology on 11/19/19 regarding skin lesions which were assessed to be likely cutaneous lupus vs eczematous dermatitis.     End of July 2021 she hit her head on the car frame as she was entering and had a severe headache. She went to the ED but left before evaluation as the wait was taking too long. Her headache has since improved substantially and she denies any vision change, tinnitus, nausea/vomiting, or weakness.     [place] [employer] trip in [place] Uneventful trip.     Developed persistent joint pains and chest pain in August 2021 - admitted to [facility].  Underwent extensive evaluation -- thought to be related to lupus and possibility of type 2 MI raised as well. She did not undergo cardiac cath as the likelihood of finding significant coronary disease is low given age. INR was subtherapeutic upon admission and she was bridged with lovenox.     Scheduled for iron infusion following last visit with me. Received it on 02/04/20 but aborted as she developed chest pain 10 mins into the test dose. Required epi-pen.     Interval history and Review of Systems:  Feeling very tired. Gets dizzy and suffers HAs multiple days a week.   Was in the ER yesterday for bleeding from the toes - INR at 6.45.   Continues to get CPs 1-2x/day. Last 10 secs or so, spontaneously resolve. Has had work-up for this in the past, all negative.   Was in [place] for 2 weeks in October. No issues.   Gained ~ 10 lbs over the last 3 months.  Attributes this to decreased activity (fatigue) and diet.   No new medications - remains on plaquenil and cellcept. Stopped them for a month between [place] given affordability. Back on it now.   A 12 systems ROS was otherwise negative.       Past Medical History:   Diagnosis Date   ??? Acute deep vein thrombosis (DVT) of femoral vein of right lower extremity (CMS-HCC) 10/12/2016   ??? Acute pulmonary embolism (CMS-HCC) 10/12/2016   ??? Anxiety    ??? Clotting disorder (CMS-HCC)    ??? CTS (carpal tunnel syndrome)    ??? Depression complicating pregnancy, antepartum 10/10/2013   ??? Diabetes mellitus (CMS-HCC)    ??? Eczema    ??? Melody syndrome (CMS-HCC)    ??? Melody syndrome (CMS-HCC)    ??? Fractures     left wrist   ??? Joint pain    ??? Lupus (CMS-HCC)    ??? Migraine    ??? Obesity    ??? Psoriasis    ??? Steatosis of liver 07/01/2014   ??? Thrombocytopenia (CMS-HCC)        Family History   Problem Relation Age of Onset   ??? Diabetes Mother    ??? Hypertension Mother    ??? Cancer Mother    ??? Depression Mother    ??? Diabetes Maternal Grandmother    ??? Cancer Maternal Grandmother    ??? Hearing loss Paternal Grandmother    ??? Arthritis Cousin    ??? Asthma Cousin    ??? No Known Problems Father    ??? No Known  Problems Sister    ??? No Known Problems Brother    ??? No Known Problems Maternal Aunt    ??? No Known Problems Maternal Uncle    ??? No Known Problems Paternal Aunt    ??? No Known Problems Paternal Uncle    ??? No Known Problems Maternal Grandfather    ??? No Known Problems Paternal Grandfather    ??? No Known Problems Other    ??? Blindness Neg Hx    ??? Melanoma Neg Hx    ??? Basal cell carcinoma Neg Hx    ??? Squamous cell carcinoma Neg Hx    ??? Anesthesia problems Neg Hx    ??? Broken bones Neg Hx    ??? Clotting disorder Neg Hx    ??? Collagen disease Neg Hx    ??? Dislocations Neg Hx    ??? Fibromyalgia Neg Hx    ??? Gout Neg Hx    ??? Hemophilia Neg Hx    ??? Osteoporosis Neg Hx    ??? Rheumatologic disease Neg Hx    ??? Scoliosis Neg Hx    ??? Severe sprains Neg Hx    ??? Sickle cell anemia Neg Hx    ??? Spinal Compression Fracture Neg Hx    ??? Glaucoma Neg Hx      Social History  Socioeconomic History   ??? Marital status: Single     Spouse name: Not on file   ??? Number of children: Not on file   ??? Years of education: Not on file   ??? Highest education level: Not on file   Occupational History   ??? 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 Smoking status: Never Smoker   ??? Smokeless tobacco: Never Used   Vaping Use   ??? Vaping Use: Never used   Substance and Sexual Activity   ??? Alcohol use: Yes     Comment: Occasionally   2 x  month    ??? Drug use: No   ??? Sexual activity: Yes     Partners: Male     Birth control/protection: I.U.D.   Other Topics Concern   ??? Do you use sunscreen? Yes   ??? Tanning bed use? No   ??? Are you easily burned? No   ??? Excessive sun exposure? No   ??? Blistering sunburns? No   Social History Narrative    Lives with parents.  Has a son who is 17 years old.      Allergies:  Allergies   Allergen Reactions   ??? Oxycodone Shortness Of Breath, Itching and Rash   ??? Mistletoe (Viscum Jordan - From [employer])      Allergic to All Types of tree's     Medications:  Ms. Brogan England has a current medication list which includes the following prescription(s): acetaminophen, albuterol, clobetasol, clobetasol, famotidine, fluorouracil, hydroxychloroquine, mycophenolate, sertraline hcl, warfarin, and warfarin, and the following Facility-Administered Medications: levonorgestrel.     Physical Exam:  VITALS: BP 109/67 (BP Site: L Arm, BP Position: Sitting, BP Cuff Size: Medium)  - Pulse 73  - Temp 36.3 ??C (97.3 ??F) (Temporal)  - Resp 16  - Ht 167.6 cm (5' 6)  - Wt (!) 111.5 kg (245 lb 12.8 oz)  - LMP 04/07/2020  - SpO2 96%  - BMI 39.67 kg/m??   GEN: Well appearing, no acute distress  HEENT: Pupils equally round, sclerae anicteric, conjunctiva clear, moist mucous membranes, no oral lesions or exudates  NECK: Supple, no JVD  HEME/LYMPH: No palpable cervical or supraclavicular lymphadenopathy  EXT:  No bony pain or tenderness. No lower extremity edema  NEURO: Alert and oriented, speech intact, following commands, moving all extremities well, no focal deficits appreciated  PSYCH: Normal mood and appropriate affect    Labs:    Results for orders placed or performed during the hospital encounter of 04/14/20   PT-INR   Result Value Ref Range    PT 74.1 (HH) 10.3 - 13.4 sec    INR 6.45    CBC w/ Differential   Result Value Ref Range    WBC 4.0 (L) 4.5 - 11.0 10*9/L    RBC 4.90 4.00 - 5.20 10*12/L    HGB 12.1 12.0 - 16.0 g/dL    HCT 16.1 09.6 - 04.5 %    MCV 76.1 (L) 80.0 - 100.0 fL    MCH 24.6 (L) 26.0 - 34.0 pg    MCHC 32.4 31.0 - 37.0 g/dL    RDW 40.9 (H) 81.1 - 15.0 %    MPV 10.9 (H) 7.0 - 10.0 fL    Platelet 268 150 - 440 10*9/L    Neutrophils % 63.7 %    Lymphocytes % 15.9 %    Monocytes % 9.7 %    Eosinophils % 7.4 %    Basophils % 0.3 %    Absolute Neutrophils 2.5 2.0 - 7.5 10*9/L    Absolute Lymphocytes 0.6 (L) 1.5 - 5.0 10*9/L    Absolute Monocytes 0.4 0.2 - 0.8 10*9/L    Absolute Eosinophils 0.3 0.0 - 0.4 10*9/L    Absolute Basophils 0.0 0.0 - 0.1 10*9/L    Large Unstained Cells 3 0 - 4 %    Microcytosis Moderate (A) Not Present    Anisocytosis Slight (A) Not Present    Hypochromasia Marked (A) Not Present   Morphology Review   Result Value Ref Range    Smear Review Comments See Comment (A) Undefined

## 2020-04-15 NOTE — Unmapped (Signed)
[facility] Emergency Department Provider Note      ED Course, Assessment and Plan     Initial Clinical Impression:    April 14, 2020 4:13 PM   Melody Padilla is a 24 y.o. female  with a past history of antiphospholipid syndrome with DVTs on warfarin, platelet dysfunction who presents with left big toe bleeding as described below. On exam, Vital signs stable.  Overall well-appearing.  Normal cardiopulmonary exam.   Normal neurologic exam.  Exam remarkable for Left great toe with small abrasion, not actively bleeding.  Evidence of ingrown toenail.    BP 137/81  - Pulse 74  - Wt (!) 111.1 kg (245 lb)  - LMP 04/07/2020  - SpO2 100%  - BMI 39.54 kg/m??     Patient has an abrasion on the toe there is bleeding.  The bleeding is likely related to her anticoagulant use.  She has not had her labs checked in quite some time.  Will check and ensure that her INR is not supratherapeutic and her platelets are normal.        INR 6.45.  Gave patient instructions to hold medication for 3 days and consider decreasing dosage in conjunction with her PCP.  She says she can easily follow-up with her PCP tomorrow and she does routinely see them for INR checks.  Her platelets and hemoglobin were normal.      Discussed treatment plan and return precautions extensively. Patient voiced a clear understanding and was amenable with the plan.    Pt stable for discharge   _____________________________________________________________________    The case was discussed with attending physician who is in agreement with the above assessment and plan    Dictation software was used while making this note. Please excuse any errors made with dictation software.    Additional Medical Decision Making     I have reviewed the vital signs and the nursing notes. Labs and radiology results that were available during my care of the patient were independently reviewed by me and considered in my medical decision making.     I independently visualized the EKG tracing if performed  I independently visualized the radiology images if performed  I reviewed the patient's prior medical records if available.  Additional history obtained from family if available    History     CHIEF COMPLAINT:   Chief Complaint   Patient presents with   ??? Toe Pain       HPI: Melody Padilla is a 24 y.o. female with a past history of antiphospholipid syndrome with DVTs on warfarin, platelet dysfunction who presents with left big toe bleeding.  She scraped her foot on the wall.  It started bleeding and bled profusely until she held pressure for significant amount of time.  She has had a small persistent lesion on the foot.  She is not had her INR checked in over 6 weeks and supposed to get checked weekly or biweekly depending on the result.  She is also not had her platelets or hemoglobin checked as recently as she is supposed to.  She denies symptoms of dizziness, weakness.    PAST MEDICAL HISTORY/PAST SURGICAL HISTORY:   Past Medical History:   Diagnosis Date   ??? Acute deep vein thrombosis (DVT) of femoral vein of right lower extremity (CMS-HCC) 10/12/2016   ??? Acute pulmonary embolism (CMS-HCC) 10/12/2016   ??? Anxiety    ??? Clotting disorder (CMS-HCC)    ??? CTS (carpal tunnel syndrome)    ???  Depression complicating pregnancy, antepartum 10/10/2013   ??? Diabetes mellitus (CMS-HCC)    ??? Eczema    ??? Evan's syndrome (CMS-HCC)    ??? Evan's syndrome (CMS-HCC)    ??? Fractures     left wrist   ??? Joint pain    ??? Lupus (CMS-HCC)    ??? Migraine    ??? Obesity    ??? Psoriasis    ??? Steatosis of liver 07/01/2014   ??? Thrombocytopenia (CMS-HCC)        Past Surgical History:   Procedure Laterality Date   ??? CHOLECYSTECTOMY     ??? PERIPHERALLY INSERTED CENTRAL CATHETER INSERTION     ??? PR LAP,CHOLECYSTECTOMY N/A 07/09/2014    Procedure: LAPAROSCOPY, SURGICAL; CHOLECYSTECTOMY;  Surgeon: Bo Mcclintock, MD;  Location: MAIN OR [facility];  Service: Trauma   ??? PR UPPER GI ENDOSCOPY,DIAGNOSIS N/A 07/07/2014    Procedure: UGI ENDO, INCLUDE ESOPHAGUS, STOMACH, & DUODENUM &/OR JEJUNUM; DX W/WO COLLECTION SPECIMN, BY BRUSH OR WASH;  Surgeon: Donneta Romberg, MD;  [facility];  Service: Gastroenterology   ??? PR WEDGE BIOPSY OF LIVER Right 07/09/2014    Procedure: BIOPSY OF LIVER;  Surgeon: Bo Mcclintock, MD;  Location: MAIN OR [facility];  Service: Trauma   ??? SKIN BIOPSY     ??? TONGUE SURGERY Midline 2006    2nd grade   ??? WISDOM TOOTH EXTRACTION         MEDICATIONS:     Current Facility-Administered Medications:   ???  levonorgestrel (MIRENA) 20 mcg/24 hr (5 years) IUD 1 kit, 1 each, Intrauterine, Once, Sydnee Levans, MD    Current Outpatient Medications:   ???  acetaminophen (TYLENOL) 325 MG tablet, Take 2 tablets (650 mg total) by mouth every six (6) hours as needed., Disp: 30 tablet, Rfl: 0  ???  albuterol HFA 90 mcg/actuation inhaler, Inhale 2 puffs every six (6) hours as needed for wheezing or shortness of breath., Disp: 8 g, Rfl: 3  ???  clobetasoL (TEMOVATE) 0.05 % cream, Apply topically Two (2) times a day., Disp: 60 g, Rfl: 1  ???  clobetasoL (TEMOVATE) 0.05 % ointment, Apply topically Two (2) times a day., Disp: 60 g, Rfl: 1  ???  famotidine (PEPCID) 20 MG tablet, Take 20 mg by mouth Two (2) times a day., Disp: , Rfl:   ???  fluorouraciL (EFUDEX) 5 % cream, Apply nightly to warts, Disp: 40 g, Rfl: 2  ???  hydrOXYchloroQUINE (PLAQUENIL) 200 mg tablet, Take 2 tablets (400 mg total) by mouth daily., Disp: 180 tablet, Rfl: 3  ???  mycophenolate (CELLCEPT) 500 mg tablet, Take 2 tablets (1,000mg  total) by mouth 2 times a day, Disp: 120 tablet, Rfl: 3  ???  sertraline HCl (SERTRALINE ORAL), Take 150 mg by mouth daily. , Disp: , Rfl:   ???  warfarin (COUMADIN) 2 MG tablet, Take 2 mg by mouth daily with evening meal., Disp: , Rfl:   ???  warfarin (COUMADIN) 5 MG tablet, Take 5 mg by mouth daily with evening meal., Disp: , Rfl:     ALLERGIES:   Oxycodone and Mistletoe (viscum Jordan - from apple trees)    SOCIAL HISTORY:   Social History Tobacco Use   ??? Smoking status: Never Smoker   ??? Smokeless tobacco: Never Used   Substance Use Topics   ??? Alcohol use: Yes     Comment: Occasionally   2 x  month        FAMILY HISTORY:  Family History  Problem Relation Age of Onset   ??? Diabetes Mother    ??? Hypertension Mother    ??? Cancer Mother    ??? Depression Mother    ??? Diabetes Maternal Grandmother    ??? Cancer Maternal Grandmother    ??? Hearing loss Paternal Grandmother    ??? Arthritis Cousin    ??? Asthma Cousin    ??? No Known Problems Father    ??? No Known Problems Sister    ??? No Known Problems Brother    ??? No Known Problems Maternal Aunt    ??? No Known Problems Maternal Uncle    ??? No Known Problems Paternal Aunt    ??? No Known Problems Paternal Uncle    ??? No Known Problems Maternal Grandfather    ??? No Known Problems Paternal Grandfather    ??? No Known Problems Other    ??? Blindness Neg Hx    ??? Melanoma Neg Hx    ??? Basal cell carcinoma Neg Hx    ??? Squamous cell carcinoma Neg Hx    ??? Anesthesia problems Neg Hx    ??? Broken bones Neg Hx    ??? Clotting disorder Neg Hx    ??? Collagen disease Neg Hx    ??? Dislocations Neg Hx    ??? Fibromyalgia Neg Hx    ??? Gout Neg Hx    ??? Hemophilia Neg Hx    ??? Osteoporosis Neg Hx    ??? Rheumatologic disease Neg Hx    ??? Scoliosis Neg Hx    ??? Severe sprains Neg Hx    ??? Sickle cell anemia Neg Hx    ??? Spinal Compression Fracture Neg Hx    ??? Glaucoma Neg Hx           Review of Systems    A 10 point review of systems was performed and is negative other than positive elements noted in HPI   Constitutional: Negative for fever.  Eyes: Negative for visual changes.  ENT: Negative for sore throat.  Cardiovascular: No chest pain.  Respiratory: Negative for shortness of breath.  Gastrointestinal: Negative for abdominal pain, vomiting or diarrhea.  Genitourinary: Negative for dysuria.  Musculoskeletal: Negative for back pain.  Skin: Negative for rash.  Neurological: Negative for headaches, focal weakness or numbness.    Physical Exam     VITAL SIGNS:    BP 137/81  - Pulse 74  - Wt (!) 111.1 kg (245 lb)  - LMP 04/07/2020  - SpO2 100%  - BMI 39.54 kg/m??     Constitutional: Alert and oriented. Well appearing and in no distress.  Eyes: Conjunctivae are normal.  ENT       Head: Normocephalic and atraumatic.       Nose: No congestion.       Mouth/Throat: Mucous membranes are moist.       Neck: No stridor.  Cardiovascular: Normal rate, regular rhythm. 2+ radial pulses equal bilaterally. <2 second cap refill.  Respiratory: Normal respiratory effort. Breath sounds are normal.  Gastrointestinal: Soft and nontender.   Genitourinary: No suprapubic tenderness  Musculoskeletal: Normal range of motion in all extremities.  Left great toe with small abrasion, not actively bleeding.  Evidence of ingrown toenail.  Neurologic: Normal speech and language. No gross focal neurologic deficits are appreciated.  Skin: Skin is warm, dry. No rash noted.  Psychiatric: Mood and affect are normal. Speech and behavior are normal.         Radiology  No orders to display     No results found.        Pertinent labs & imaging results that were available during my care of the patient were reviewed by me and considered in my medical decision making (see chart for details).    Please note- This chart has been created using [employer]. Chart creation errors have been sought, but may not always be located and such creation errors, especially pronoun confusion, do NOT reflect on the standard of medical care.     Marveen Reeks, MD  Resident  04/14/20 [phone]

## 2020-04-15 NOTE — Unmapped (Signed)
Morphology Review  Order: [id number] - Part of Panel Order [id number]  Status: Final result ??    CBC w/ Differential  Order: [id number] - Part of Panel Order [id number]  Status: Final result ??    Sent to provider for review

## 2020-04-19 NOTE — Unmapped (Signed)
[facility] Clinical Assessment & Refill Coordination Note    Melody Padilla, [name]: Mar 15, 1996  Phone: [phone] (home)     All above HIPAA information was verified with patient.     Was a Nurse, learning disability used for this call? No    Specialty Medication(s):   Inflammatory Disorders: mycophenolate     Current Outpatient Medications   Medication Sig Dispense Refill   ??? acetaminophen (TYLENOL) 325 MG tablet Take 2 tablets (650 mg total) by mouth every six (6) hours as needed. 30 tablet 0   ??? albuterol HFA 90 mcg/actuation inhaler Inhale 2 puffs every six (6) hours as needed for wheezing or shortness of breath. 8 g 3   ??? clobetasoL (TEMOVATE) 0.05 % cream Apply topically Two (2) times a day. 60 g 1   ??? clobetasoL (TEMOVATE) 0.05 % ointment Apply topically Two (2) times a day. 60 g 1   ??? famotidine (PEPCID) 20 MG tablet Take 20 mg by mouth Two (2) times a day.     ??? fluorouraciL (EFUDEX) 5 % cream Apply nightly to warts 40 g 2   ??? hydrOXYchloroQUINE (PLAQUENIL) 200 mg tablet Take 2 tablets (400 mg total) by mouth daily. 180 tablet 3   ??? mycophenolate (CELLCEPT) 500 mg tablet Take 2 tablets (1,000mg  total) by mouth 2 times a day 120 tablet 3   ??? sertraline HCl (SERTRALINE ORAL) Take 150 mg by mouth daily.      ??? warfarin (COUMADIN) 2 MG tablet Take 2 mg by mouth daily with evening meal.     ??? warfarin (COUMADIN) 5 MG tablet Take 5 mg by mouth daily with evening meal.       Current Facility-Administered Medications   Medication Dose Route Frequency Provider Last Rate Last Admin   ??? levonorgestrel (MIRENA) 20 mcg/24 hr (5 years) IUD 1 kit  1 each Intrauterine Once Sydnee Levans, MD            Changes to medications: Melody Padilla reports no changes at this time.    Allergies   Allergen Reactions   ??? Oxycodone Shortness Of Breath, Itching and Rash   ??? Mistletoe (Viscum Jordan - From [employer])      Allergic to All Types of tree's       Changes to allergies: No    SPECIALTY MEDICATION ADHERENCE     mycophenolate 500mg : 4 days of medicine on hand     Medication Adherence    Patient reported X missed doses in the last month: 3-4  Specialty Medication: mycophenolate 500mg           Specialty medication(s) dose(s) confirmed: Regimen is correct and unchanged.     Are there any concerns with adherence? Yes: [name] states she still sometimes forgets her morning doses    Adherence counseling provided? Yes: I suggested setting reminders/alarms on multiple devices to provide a higher liklihood she wouldn't forget her morning dose    CLINICAL MANAGEMENT AND INTERVENTION      Clinical Benefit Assessment:    Do you feel the medicine is effective or helping your condition? Yes    Clinical Benefit counseling provided? Not needed    Adverse Effects Assessment:    Are you experiencing any side effects? No    Are you experiencing difficulty administering your medicine? No    Quality of Life Assessment:    Rheumatology:   Quality of Life    On a scale of 1 ??? 10 with 1  representing not at all and 10 representing completely ??? how has your rheumatologic condition affected your:  Daily pain level?: decline to answer  Ability to complete your regular daily tasks (prepare meals, get dressed, etc.)?: decline to answer  Ability to participate in social or family activities?: decline to answer         Have you discussed this with your provider? Not needed    Therapy Appropriateness:    Is therapy appropriate? Yes, therapy is appropriate and should be continued    DISEASE/MEDICATION-SPECIFIC INFORMATION      N/A    PATIENT SPECIFIC NEEDS     - Does the patient have any physical, cognitive, or cultural barriers? No    - Is the patient high risk? Yes, patient is taking a REMS drug. Medication is dispensed in compliance with REMS program    - Does the patient require a Care Management Plan? No     - Does the patient require physician intervention or other additional services (i.e. nutrition, smoking cessation, social work)? No      SHIPPING     Specialty Medication(s) to be Shipped:   Inflammatory Disorders: mycophenolate 500mg     Other medication(s) to be shipped: No additional medications requested for fill at this time     Changes to insurance: No    Delivery Scheduled: Yes, Expected medication delivery date: 04/21/2020.     Medication will be delivered via Next Day Courier to the confirmed prescription address in [facility].    The patient will receive a drug information handout for each medication shipped and additional FDA Medication Guides as required.  Verified that patient has previously received a Conservation officer, historic buildings.    All of the patient's questions and concerns have been addressed.    Melody Padilla   [facility] Specialty Pharmacist

## 2020-04-20 MED FILL — MYCOPHENOLATE MOFETIL 500 MG TABLET: 30 days supply | Qty: 120 | Fill #1 | Status: AC

## 2020-04-20 MED FILL — MYCOPHENOLATE MOFETIL 500 MG TABLET: ORAL | 30 days supply | Qty: 120 | Fill #1

## 2020-05-02 DIAGNOSIS — D6861 Antiphospholipid syndrome: Principal | ICD-10-CM

## 2020-05-02 DIAGNOSIS — D6941 Evans syndrome: Principal | ICD-10-CM

## 2020-05-02 DIAGNOSIS — D590 Drug-induced autoimmune hemolytic anemia: Principal | ICD-10-CM

## 2020-05-19 ENCOUNTER — Emergency Department
Admit: 2020-05-19 | Discharge: 2020-05-20 | Disposition: A | Discharge status: Home / Self Care | Payer: MEDICARE | Attending: Emergency Medicine

## 2020-05-19 ENCOUNTER — Ambulatory Visit
Admit: 2020-05-19 | Discharge: 2020-05-20 | Disposition: A | Discharge status: Home / Self Care | Payer: MEDICARE | Attending: Emergency Medicine

## 2020-05-19 DIAGNOSIS — D689 Coagulation defect, unspecified: Principal | ICD-10-CM

## 2020-05-19 DIAGNOSIS — R918 Other nonspecific abnormal finding of lung field: Principal | ICD-10-CM

## 2020-05-19 DIAGNOSIS — R070 Pain in throat: Principal | ICD-10-CM

## 2020-05-19 DIAGNOSIS — L932 Other local lupus erythematosus: Principal | ICD-10-CM

## 2020-05-19 DIAGNOSIS — D6941 Evans syndrome: Principal | ICD-10-CM

## 2020-05-19 DIAGNOSIS — Z8249 Family history of ischemic heart disease and other diseases of the circulatory system: Principal | ICD-10-CM

## 2020-05-19 DIAGNOSIS — Z20822 Contact with and (suspected) exposure to covid-19: Principal | ICD-10-CM

## 2020-05-19 DIAGNOSIS — Z79899 Other long term (current) drug therapy: Principal | ICD-10-CM

## 2020-05-19 DIAGNOSIS — Z86711 Personal history of pulmonary embolism: Principal | ICD-10-CM

## 2020-05-19 DIAGNOSIS — R0602 Shortness of breath: Principal | ICD-10-CM

## 2020-05-19 DIAGNOSIS — Z833 Family history of diabetes mellitus: Principal | ICD-10-CM

## 2020-05-19 DIAGNOSIS — E119 Type 2 diabetes mellitus without complications: Principal | ICD-10-CM

## 2020-05-19 DIAGNOSIS — F419 Anxiety disorder, unspecified: Principal | ICD-10-CM

## 2020-05-19 DIAGNOSIS — G43909 Migraine, unspecified, not intractable, without status migrainosus: Principal | ICD-10-CM

## 2020-05-19 DIAGNOSIS — R079 Chest pain, unspecified: Principal | ICD-10-CM

## 2020-05-19 DIAGNOSIS — Z86718 Personal history of other venous thrombosis and embolism: Principal | ICD-10-CM

## 2020-05-19 DIAGNOSIS — Z7901 Long term (current) use of anticoagulants: Principal | ICD-10-CM

## 2020-05-19 LAB — CBC W/ AUTO DIFF
BASOPHILS ABSOLUTE COUNT: 0 10*9/L (ref 0.0–0.1)
BASOPHILS RELATIVE PERCENT: 0.6 %
EOSINOPHILS ABSOLUTE COUNT: 0.2 10*9/L (ref 0.0–0.4)
EOSINOPHILS RELATIVE PERCENT: 3.8 %
HEMATOCRIT: 37.3 % (ref 36.0–46.0)
HEMOGLOBIN: 12.5 g/dL (ref 12.0–16.0)
LARGE UNSTAINED CELLS: 3 % (ref 0–4)
LYMPHOCYTES ABSOLUTE COUNT: 0.4 10*9/L — ABNORMAL LOW (ref 1.5–5.0)
LYMPHOCYTES RELATIVE PERCENT: 8.6 %
MEAN CORPUSCULAR HEMOGLOBIN CONC: 33.5 g/dL (ref 31.0–37.0)
MEAN CORPUSCULAR HEMOGLOBIN: 25.2 pg — ABNORMAL LOW (ref 26.0–34.0)
MEAN CORPUSCULAR VOLUME: 75.3 fL — ABNORMAL LOW (ref 80.0–100.0)
MEAN PLATELET VOLUME: 11 fL — ABNORMAL HIGH (ref 7.0–10.0)
MONOCYTES ABSOLUTE COUNT: 0.4 10*9/L (ref 0.2–0.8)
MONOCYTES RELATIVE PERCENT: 7.1 %
NEUTROPHILS ABSOLUTE COUNT: 3.9 10*9/L (ref 2.0–7.5)
NEUTROPHILS RELATIVE PERCENT: 76.6 %
PLATELET COUNT: 206 10*9/L (ref 150–440)
RED BLOOD CELL COUNT: 4.96 10*12/L (ref 4.00–5.20)
RED CELL DISTRIBUTION WIDTH: 16.8 % — ABNORMAL HIGH (ref 12.0–15.0)
WBC ADJUSTED: 5.1 10*9/L (ref 4.5–11.0)

## 2020-05-19 LAB — COMPREHENSIVE METABOLIC PANEL
ALBUMIN: 4.1 g/dL (ref 3.4–5.0)
ALKALINE PHOSPHATASE: 67 U/L (ref 46–116)
ALT (SGPT): 50 U/L — ABNORMAL HIGH (ref 10–49)
ANION GAP: 10 mmol/L (ref 5–14)
AST (SGOT): 43 U/L — ABNORMAL HIGH (ref <=34)
BILIRUBIN TOTAL: 1.1 mg/dL (ref 0.3–1.2)
BLOOD UREA NITROGEN: <5 mg/dL — ABNORMAL LOW (ref 9–23)
CALCIUM: 9.9 mg/dL (ref 8.7–10.4)
CHLORIDE: 107 mmol/L (ref 98–107)
CO2: 22 mmol/L (ref 20.0–31.0)
CREATININE: 0.57 mg/dL — ABNORMAL LOW
EGFR CKD-EPI AA FEMALE: >90 mL/min/{1.73_m2} (ref >=60)
EGFR CKD-EPI NON-AA FEMALE: >90 mL/min/{1.73_m2} (ref >=60)
GLUCOSE RANDOM: 128 mg/dL (ref 70–179)
POTASSIUM: 3.4 mmol/L (ref 3.4–4.5)
PROTEIN TOTAL: 7.4 g/dL (ref 5.7–8.2)
SODIUM: 139 mmol/L (ref 135–145)

## 2020-05-19 LAB — HIGH SENSITIVITY TROPONIN I - SINGLE: HIGH SENSITIVITY TROPONIN I: <3 ng/L (ref <=34)

## 2020-05-19 LAB — HCG QUANTITATIVE, BLOOD: GONADOTROPIN, CHORIONIC (HCG) QUANT: <2.6 m[IU]/mL

## 2020-05-20 DIAGNOSIS — D689 Coagulation defect, unspecified: Principal | ICD-10-CM

## 2020-05-20 DIAGNOSIS — Z7901 Long term (current) use of anticoagulants: Principal | ICD-10-CM

## 2020-05-20 DIAGNOSIS — D6941 Evans syndrome: Principal | ICD-10-CM

## 2020-05-20 DIAGNOSIS — E119 Type 2 diabetes mellitus without complications: Principal | ICD-10-CM

## 2020-05-20 DIAGNOSIS — R918 Other nonspecific abnormal finding of lung field: Principal | ICD-10-CM

## 2020-05-20 DIAGNOSIS — Z86718 Personal history of other venous thrombosis and embolism: Principal | ICD-10-CM

## 2020-05-20 DIAGNOSIS — Z8249 Family history of ischemic heart disease and other diseases of the circulatory system: Principal | ICD-10-CM

## 2020-05-20 DIAGNOSIS — R0602 Shortness of breath: Principal | ICD-10-CM

## 2020-05-20 DIAGNOSIS — R070 Pain in throat: Principal | ICD-10-CM

## 2020-05-20 DIAGNOSIS — Z833 Family history of diabetes mellitus: Principal | ICD-10-CM

## 2020-05-20 DIAGNOSIS — Z86711 Personal history of pulmonary embolism: Principal | ICD-10-CM

## 2020-05-20 DIAGNOSIS — F419 Anxiety disorder, unspecified: Principal | ICD-10-CM

## 2020-05-20 DIAGNOSIS — Z20822 Contact with and (suspected) exposure to covid-19: Principal | ICD-10-CM

## 2020-05-20 DIAGNOSIS — Z79899 Other long term (current) drug therapy: Principal | ICD-10-CM

## 2020-05-20 DIAGNOSIS — G43909 Migraine, unspecified, not intractable, without status migrainosus: Principal | ICD-10-CM

## 2020-05-20 DIAGNOSIS — L932 Other local lupus erythematosus: Principal | ICD-10-CM

## 2020-05-20 LAB — SLIDE REVIEW

## 2020-05-20 LAB — PROTIME-INR
INR: 2.46
PROTIME: 28.5 s — ABNORMAL HIGH (ref 10.3–13.4)

## 2020-05-20 LAB — APTT
APTT: 120.3 s — ABNORMAL HIGH (ref 24.9–36.9)
HEPARIN CORRELATION: 0.7

## 2020-05-20 MED ADMIN — iohexoL (OMNIPAQUE) 350 mg iodine/mL solution 75 mL: 75 mL | INTRAVENOUS | @ 07:00:00 | Stop: 2020-05-20

## 2020-05-20 MED ADMIN — acetaminophen (TYLENOL) tablet 1,000 mg: 1000 mg | ORAL | @ 04:00:00 | Stop: 2020-05-19

## 2020-05-20 NOTE — Unmapped (Signed)
Chest pain, SOB, started this morning. H/o blood clots. Sore throat started 2 days ago.

## 2020-05-20 NOTE — Unmapped (Signed)
Bed: 68-D  Expected date:   Expected time:   Means of arrival:   Comments:  charge

## 2020-05-20 NOTE — Unmapped (Signed)
Patient ready for discharge at this time. No ride until her mother comes in the morning. Per charge nurse, patient okay to wait in team C. Moved to C now. Triage nurse notified.

## 2020-05-20 NOTE — Unmapped (Signed)
[facility] Emergency Department Provider Note      ED Course, Assessment, and Plan     Impression: Melody Padilla is a 25 y.o. 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  Vitals significant for mild tachypnea but otherwise within normal limits.  On exam patient is well-appearing and in no distress, breathing comfortably on room air able to speak in full sentences, lungs clear bilaterally with normal heart sounds, faint lower extremity peripheral edema, abdomen soft and nontender, intact strength and sensation to all extremities with intact cranial nerves.    Overall patient is well-appearing with reassuring vital signs.  Concern for PE given her recent subtherapeutic INR and history of blood clots with pulmonary embolism, lower concern for ACS or pneumothorax as her vitals are within normal limits and she appears well with more flulike symptoms.  Symptoms most consistent with viral illness such as flu or COVID.  Plan for labs, EKG, CTA chest, coags, and Tylenol for body aches.  Of note, patient has had four CTA chest studies done in the past year, had risk versus benefit discussion with the patient and she would prefer to have the CT scan given her sense that her symptoms are worse than her previous Pes, given that her INR has been subtherapeutic and COVID has reports of hypercoagulability I think this is reasonable.    BP 120/76  - Pulse 83  - Temp 37 ??C (98.6 ??F) (Oral)  - Resp 22  - Wt (!) 113.4 kg (250 lb)  - SpO2 99%  - BMI 40.35 kg/m??     Progress Notes:  ED Course as of 05/20/20 0302   Wed May 19, 2020   2319 EKG shows normal sinus rhythm, rate 88, normal intervals, no ST elevations or depressions.  COVID swab is positive, CBC and CMP nonactionable with normal electrolytes.  INR and CTA chest pending   Thu May 20, 2020   0027 INR is therapeutic at 2.46.   0259 CTA chest limited by poor contrast timing but is without evidence of large pulmonary embolism.  Overall patient's evaluation is reassuring and I suspect her symptoms are related to her COVID infection.  Her repeat vitals are within normal limits showing normal heart rate, no tachypnea, and satting greater than 95% on room air.  She is stable for discharge at this time.  Return precautions provided, patient is agreeable to plan and has expressed understanding.  Final disposition: Discharge.     _____________________________________________________________________    The case was discussed with the attending physician who is in agreement with the above assessment and plan.    Additional Medical Decision Making     The patient's vital signs, EKG tracing, and pertinent labs and imaging results that were available during my care of the patient have been independently reviewed by me and considered in my medical decision making.  I have reviewed the patient's prior medical records where available.    History     Chief Complaint:   Chief Complaint   Patient presents with   ??? Shortness of Breath       History of Present Illness: Melody Padilla is a 25 y.o. 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  Patient  describes sore throat, cough, chest pain, shortness of breath, and body aches present since Monday.  She is vaccinated against COVID.  Was exposed to an aunt with flulike symptoms over the weekend.  She states her INR has been subtherapeutic as recently as 1 week ago after missing a dose of her warfarin, her current dose is 7 mg daily.  She does have a history of pulmonary embolism and antiphospholipid syndrome.  States this shortness of breath and chest pain feels worse than her previous PEs, endorsing pleuritic chest pain with shortness of breath worsened by exertion.  Denies oral contraceptive use.  She has been tolerating oral intake without issue.  Endorses chills but no objective fevers, denies nausea or vomiting, abdominal pain, diarrhea, numbness or tingling, weakness.  Denies tobacco use, alcohol, or recreational drug use.    Past Medical History:  Past Medical History:   Diagnosis Date   ??? Acute deep vein thrombosis (DVT) of femoral vein of right lower extremity (CMS-HCC) 10/12/2016   ??? Acute pulmonary embolism (CMS-HCC) 10/12/2016   ??? Anxiety    ??? Clotting disorder (CMS-HCC)    ??? CTS (carpal tunnel syndrome)    ??? Depression complicating pregnancy, antepartum 10/10/2013   ??? Diabetes mellitus (CMS-HCC)    ??? Eczema    ??? Evan's syndrome (CMS-HCC)    ??? Evan's syndrome (CMS-HCC)    ??? Fractures     left wrist   ??? Joint pain    ??? Lupus (CMS-HCC)    ??? Migraine    ??? Obesity    ??? Psoriasis    ??? Steatosis of liver 07/01/2014   ??? Thrombocytopenia (CMS-HCC)        Medications:     Current Facility-Administered Medications:   ???  levonorgestrel (MIRENA) 20 mcg/24 hr (5 years) IUD 1 kit, 1 each, Intrauterine, Once, Sydnee Levans, MD    Current Outpatient Medications:   ???  acetaminophen (TYLENOL) 325 MG tablet, Take 2 tablets (650 mg total) by mouth every six (6) hours as needed., Disp: 30 tablet, Rfl: 0  ???  albuterol HFA 90 mcg/actuation inhaler, Inhale 2 puffs every six (6) hours as needed for wheezing or shortness of breath., Disp: 8 g, Rfl: 3  ???  clobetasoL (TEMOVATE) 0.05 % cream, Apply topically Two (2) times a day., Disp: 60 g, Rfl: 1  ???  clobetasoL (TEMOVATE) 0.05 % ointment, Apply topically Two (2) times a day., Disp: 60 g, Rfl: 1  ???  famotidine (PEPCID) 20 MG tablet, Take 20 mg by mouth Two (2) times a day., Disp: , Rfl:   ???  fluorouraciL (EFUDEX) 5 % cream, Apply nightly to warts, Disp: 40 g, Rfl: 2  ???  hydrOXYchloroQUINE (PLAQUENIL) 200 mg tablet, Take 2 tablets (400 mg total) by mouth daily., Disp: 180 tablet, Rfl: 3  ???  mycophenolate (CELLCEPT) 500 mg tablet, Take 2 tablets (1,000mg  total) by mouth 2 times a day, Disp: 120 tablet, Rfl: 3  ???  sertraline HCl (SERTRALINE ORAL), Take 150 mg by mouth daily. , Disp: , Rfl:   ???  warfarin (COUMADIN) 2 MG tablet, Take 2 mg by mouth daily with evening meal., Disp: , Rfl:   ???  warfarin (COUMADIN) 5 MG tablet, Take 5 mg by mouth daily with evening meal., Disp: , Rfl:   Patient's Medications   New Prescriptions    No medications on file   Previous Medications    ACETAMINOPHEN (TYLENOL) 325 MG TABLET    Take 2 tablets (650 mg  total) by mouth every six (6) hours as needed.    ALBUTEROL HFA 90 MCG/ACTUATION INHALER    Inhale 2 puffs every six (6) hours as needed for wheezing or shortness of breath.    CLOBETASOL (TEMOVATE) 0.05 % CREAM    Apply topically Two (2) times a day.    CLOBETASOL (TEMOVATE) 0.05 % OINTMENT    Apply topically Two (2) times a day.    FAMOTIDINE (PEPCID) 20 MG TABLET    Take 20 mg by mouth Two (2) times a day.    FLUOROURACIL (EFUDEX) 5 % CREAM    Apply nightly to warts    HYDROXYCHLOROQUINE (PLAQUENIL) 200 MG TABLET    Take 2 tablets (400 mg total) by mouth daily.    MYCOPHENOLATE (CELLCEPT) 500 MG TABLET    Take 2 tablets (1,000mg  total) by mouth 2 times a day    SERTRALINE HCL (SERTRALINE ORAL)    Take 150 mg by mouth daily.     WARFARIN (COUMADIN) 2 MG TABLET    Take 2 mg by mouth daily with evening meal.    WARFARIN (COUMADIN) 5 MG TABLET    Take 5 mg by mouth daily with evening meal.   Modified Medications    No medications on file   Discontinued Medications    No medications on file       Allergies:   Oxycodone and Mistletoe (viscum Jordan - from apple trees)    Past Surgical History:   Past Surgical History:   Procedure Laterality Date   ??? CHOLECYSTECTOMY     ??? PERIPHERALLY INSERTED CENTRAL CATHETER INSERTION     ??? PR LAP,CHOLECYSTECTOMY N/A 07/09/2014    Procedure: LAPAROSCOPY, SURGICAL; CHOLECYSTECTOMY;  Surgeon: Bo Mcclintock, MD;  Location: MAIN [facility];  Service: Trauma   ??? PR UPPER GI ENDOSCOPY,DIAGNOSIS N/A 07/07/2014    Procedure: UGI ENDO, INCLUDE ESOPHAGUS, STOMACH, & DUODENUM &/OR JEJUNUM; DX W/WO COLLECTION SPECIMN, BY BRUSH OR WASH;  Surgeon: Donneta Romberg, MD;  Location: [facility];  Service: Gastroenterology   ??? PR WEDGE BIOPSY OF LIVER Right 07/09/2014    Procedure: BIOPSY OF LIVER;  Surgeon: Bo Mcclintock, MD;  Location: MAIN OR [facility];  Service: Trauma   ??? SKIN BIOPSY     ??? TONGUE SURGERY Midline 2006    2nd grade   ??? WISDOM TOOTH EXTRACTION         Social History:   Social History     Tobacco Use   ??? Smoking status: Never Smoker   ??? Smokeless tobacco: Never Used   Substance Use Topics   ??? Alcohol use: Yes     Comment: Occasionally   2 x  month        Family History:  Family History   Problem Relation Age of Onset   ??? Diabetes Mother    ??? Hypertension Mother    ??? Cancer Mother    ??? Depression Mother    ??? Diabetes Maternal Grandmother    ??? Cancer Maternal Grandmother    ??? Hearing loss Paternal Grandmother    ??? Arthritis Cousin    ??? Asthma Cousin    ??? No Known Problems Father    ??? No Known Problems Sister    ??? No Known Problems Brother    ??? No Known Problems Maternal Aunt    ??? No Known Problems Maternal Uncle    ??? No Known Problems Paternal Aunt    ??? No Known Problems Paternal Uncle    ???  No Known Problems Maternal Grandfather    ??? No Known Problems Paternal Grandfather    ??? No Known Problems Other    ??? Blindness Neg Hx    ??? Melanoma Neg Hx    ??? Basal cell carcinoma Neg Hx    ??? Squamous cell carcinoma Neg Hx    ??? Anesthesia problems Neg Hx    ??? Broken bones Neg Hx    ??? Clotting disorder Neg Hx    ??? Collagen disease Neg Hx    ??? Dislocations Neg Hx    ??? Fibromyalgia Neg Hx    ??? Gout Neg Hx    ??? Hemophilia Neg Hx    ??? Osteoporosis Neg Hx    ??? Rheumatologic disease Neg Hx    ??? Scoliosis Neg Hx    ??? Severe sprains Neg Hx    ??? Sickle cell anemia Neg Hx    ??? Spinal Compression Fracture Neg Hx    ??? Glaucoma Neg Hx         Review of Systems:  10-point ROS obtained and otherwise negative except as noted in HPI.      Physical Exam     Vital Signs:    BP 120/76  - Pulse 83  - Temp 37 ??C (98.6 ??F) (Oral)  - Resp 22  - Wt (!) 113.4 kg (250 lb)  - SpO2 99%  - BMI 40.35 kg/m??     General: Alert and oriented. Well-appearing and in no distress.  Skin: Skin is warm and well-perfused. No rashes noted.  HEENT: Normocephalic and atraumatic, moist mucous membranes, no nasal drainage, no intra-oral lesions or erythema.  Lungs: Normal respiratory effort. Breath sounds clear bilaterally.  Heart: Normal rate, regular rhythm. No murmurs appreciated.  Abdomen: Soft and non-distended, non-tender to palpation.  Genitourinary: Deferred  Extremities: Faint LE peripheral edema. Pulses are normal and symmetric in upper and lower extremities.  Lymphatic: No cervical or supraclavicular lymphadenopathy noted.  Neurological: Normal speech and language. No gross focal neurologic deficits are appreciated.  Psychiatric: Normal affect and behavior for situation.    Radiology     CTA Chest W Contrast    (Results Pending)       Labs     Labs Reviewed   RAPID INFLUENZA/RSV/COVID PCR - Abnormal; Notable for the following components:       Result Value    SARS-CoV-2 PCR Positive (*)     All other components within normal limits    Narrative:     This test was performed using the Cepheid Xpert Xpress CoV-2/Flu/RSV plus assay, which has been validated by the CLIA-certified, CAP-inspected [employer] FDA has granted Emergency Use Authorization for this test. Negative results do not preclude infection and should be interpreted along with clinical observations, patient history, and epidemiological information. Information for providers and patients can be found here: [email or web address]   COMPREHENSIVE METABOLIC PANEL - Abnormal; Notable for the following components:    BUN <5 (*)     Creatinine 0.57 (*)     AST 43 (*)     ALT 50 (*)     All other components within normal limits   CBC W/ AUTO DIFF - Abnormal; Notable for the following components:    MCV 75.3 (*)     MCH 25.2 (*)     RDW 16.8 (*)     MPV 11.0 (*)     Neutrophil Left Shift 1+ (*)     Absolute Lymphocytes 0.4 (*)  Microcytosis Marked (*)     Anisocytosis Slight (*)     Hypochromasia Slight (*)     All other components within normal limits    Narrative:     Please use the Absolute Differential for reference ranges.    HIGH SENSITIVITY TROPONIN I - SINGLE - Normal   HCG QUANTITATIVE, BLOOD    Narrative:     Non-Pregnant Females: 1.5 - 4.2 mIU/mL                                    Post and Peri-menopausal Females: 1.8 - 10.1 mIU/mL                                    During pregnancy, hCG increases exponentially for about 8-10 weeks after conception and begins falling at about 12 weeks. Week-to-week hCG levels during pregnancy show significant overlap and are not a reliable means of determining gestational age. Post and peri-menopausal females may have low levels of detectable hCG due to pituitary production of hCG; in such cases correlation with serum FSH may be helpful. Test interference may occur from heterophilic antibodies or high levels of biotin.                                       CBC W/ DIFFERENTIAL    Narrative:     The following orders were created for panel order CBC w/ Differential.                  Procedure                               Abnormality         Status                                     ---------                               -----------         ------                                     CBC w/ [id number]         Abnormal            Final result                               Morphology [id number] [phone]]                               In process                                                   Please view results for  these tests on the individual orders.   SLIDE REVIEW _____________________________________________________________________    Orders Placed This Encounter:  Orders Placed This Encounter   Procedures   ??? RAPID INFLUENZA/RSV/COVID PCR   ??? CTA Chest W Contrast   ??? CBC w/ Differential   ??? Comprehensive metabolic panel   ??? hsTroponin I (single, no delta)   ??? Pregnancy, hCG QUANTitative, Serum   ??? ECG 12 Lead       Please note - This documentation was generated using dictation and/or voice recognition software, and as such, may contain spelling or other transcription errors. Any questions regarding the content of this documentation should be directed to the individual who electronically signed.     Anders Grant, MD  Resident  05/20/20 [phone]

## 2020-05-31 NOTE — Unmapped (Signed)
[facility] Refill Coordination Note    Specialty Medication(s) to be Shipped:   Inflammatory Disorders: mycophenolate    Other medication(s) to be shipped: No additional medications requested for fill at this time     Melody Padilla, DOB: 12/03/1995  Phone: [phone] (home)       All above HIPAA information was verified with patient.     Was a Nurse, learning disability used for this call? No    Completed refill call assessment today to schedule patient's medication shipment from the [facility] [phone]).       Specialty medication(s) and dose(s) confirmed: Regimen is correct and unchanged.   Changes to medications: Armonie reports no changes at this time.  Changes to insurance: No  Questions for the pharmacist: No    Confirmed patient received Welcome Packet with first shipment. The patient will receive a drug information handout for each medication shipped and additional FDA Medication Guides as required.       DISEASE/MEDICATION-SPECIFIC INFORMATION        N/A    SPECIALTY MEDICATION ADHERENCE     Medication Adherence    Patient reported X missed doses in the last month: 0  Specialty Medication: mycophenolate 500mg   Patient is on additional specialty medications: No  Patient is on more than two specialty medications: No  Any gaps in refill history greater than 2 weeks in the last 3 months: no  Demonstrates understanding of importance of adherence: yes  Informant: patient                Mycophenolate 500mg : Patient has 3 days of medication on hand      SHIPPING     Shipping address confirmed in Epic.     Delivery Scheduled: Yes, Expected medication delivery date: 2/2.     Medication will be delivered via Next Day Courier to the prescription address in Epic [facility].    Melody Padilla   [facility] Specialty Technician

## 2020-06-01 DIAGNOSIS — D6941 Evans syndrome: Principal | ICD-10-CM

## 2020-06-01 DIAGNOSIS — D6861 Antiphospholipid syndrome: Principal | ICD-10-CM

## 2020-06-01 DIAGNOSIS — D590 Drug-induced autoimmune hemolytic anemia: Principal | ICD-10-CM

## 2020-06-01 MED FILL — MYCOPHENOLATE MOFETIL 500 MG TABLET: ORAL | 30 days supply | Qty: 120 | Fill #2

## 2020-06-16 ENCOUNTER — Ambulatory Visit: Admit: 2020-06-16 | Discharge: 2020-06-17 | Payer: MEDICARE | Attending: Ophthalmology | Primary: Ophthalmology

## 2020-06-16 DIAGNOSIS — Z79899 Other long term (current) drug therapy: Principal | ICD-10-CM

## 2020-06-16 NOTE — Unmapped (Addendum)
Dry eye syndrome of bilateral lacrimal glands  - ATs QID PRN  ??  Antiphospholipid antibody syndrome (CMS-HCC)  - No evidence of tortuosity of retinal vessels or boxcarring of vessels  ??  Long-term use of Plaquenil (started 2017 for antiphospholipid syndrome), also taking cellcept  - For SLE, no kidney / liver issues  - 3.99 mg / kg / day, 2 years  - OCT / AF / VF (04/09/18): wnl  - Continue yearly testing indefinitely  ??  Emmetropia  - C/wo glasses  ??  Floaters / Migraine with aura - stable, no h/t/d on dfe, monitor    [place] dot both eyes - stable, monitor    RTC 1 year repeat plaquenil testing HVF 10-2/FAF/mac OCT

## 2020-06-25 ENCOUNTER — Ambulatory Visit: Admit: 2020-06-25 | Discharge: 2020-06-26 | Payer: MEDICARE

## 2020-06-25 DIAGNOSIS — G4733 Obstructive sleep apnea (adult) (pediatric): Principal | ICD-10-CM

## 2020-06-25 DIAGNOSIS — R0600 Dyspnea, unspecified: Principal | ICD-10-CM

## 2020-06-25 NOTE — Unmapped (Signed)
DIVISION OF CARDIOLOGY  [facility] [place]        Date of Service: 06/25/2020      PCP: Referring Provider:   Tyson Babinski [facility]  [address] [place] [facility]  [place] [address]  Phone: [phone]  Fax: [phone] [facility]  [address] [facility]  [place],  [place] [address]  Phone: [phone]  Fax: [phone]     ______________________________________________________________________________________________      ASSESSMENT AND PLAN:       1. 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    - her symptoms though appear to be somewhat out of proportion to the degree of her CT findings at first glance.  - she also does not appear to have pulmonary hypertension although this might be undervisualized.   - she has some brief episodes of chest pain and fluttering.    - she has gained about 10 lbs due to steroids/etc.  I have encouraged her to lose some weight and exercise.  If her symptoms do not improve, we can consider further workup of chronic thromboembolic disease.      Jacquelyne Balint, MD,  [facility], [facility]  Interventional Cardiology  Assistant Professor of Medicine  [place] [facility] [place] at [facility]    ______________________________________________________________________________________________      SUBJECTIVE:     HISTORY OF PRESENT ILLNESS:    Dear Dr at [facility],      I had the pleasure of seeing Melody Padilla in our adult general cardiology clinic today for follow up of chronic PE / Chronic thromboembolic disease.    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  She had a PE back in 2018 and was placed on coumadin.  Because of her Evan's syndrome, she had been intermittently off coumadin as well.  In October of 2020 however, she was found to have chronic PE and was restarted on Coumadin.  She has been on Coumadin since.  Repeat CT earlier this year reveals that she has still a chronic PE in the left lower lobe.      She states that she has very brief episodes of chest pain.  She also has dyspnea on exertion - this worsened after her recent hospitalization where she required steroids and gained 10-15 lbs.  She has tried to do Zumba but she has felt significantly short of breath.  Her echocardiogram does not show any pulmonary hypertension.    We discussed attempts at losing some weight.  Her echocardiogram does not indicate any significant pulmonary hypertension, although it can be missed - but lifestyle modification first would be appropriate.  If this does not help improve her symptoms, then a RHC would be reasonable to determine whether she has pulmonary HTN that was missed on echo and also a pulmonary angiogram to evaluate for chronic thromboembolic disease.  This is less likely based on the echo findings but that is an option that we can consider if her symptoms do not improve.    She had been doing Zumba in the past and has been having difficulty doing her workouts now.  She also has been experiencing difficulty walking about 2 flights of stairs.      CARDIOVASCULAR HISTORY AND PROCEDURES    Cardiovascular Studies  Date Comments     ECG 2021 NSR   Echo 2021 NL LV function / no pulmonary HTN   Stress test     Cardiac catheterization     Electrophysiology      Cardiovascular Surgery     Peripheral Vascular Studies         I have reviewed the cardiology tests personally.      PAST MEDICAL HISTORY  Past Medical History:   Diagnosis Date   ??? Acute deep vein thrombosis (DVT) of femoral vein of right lower extremity (CMS-HCC) 10/12/2016   ??? Acute pulmonary embolism (CMS-HCC) 10/12/2016   ??? Anxiety    ??? Clotting disorder (CMS-HCC)    ??? CTS (carpal tunnel syndrome)    ??? Depression complicating pregnancy, antepartum 10/10/2013   ??? Eczema    ??? Evan's syndrome (CMS-HCC)    ??? Evan's syndrome (CMS-HCC)    ??? Fractures     left wrist   ??? Joint pain    ??? Lupus (CMS-HCC)    ??? Migraine    ??? Obesity    ??? Psoriasis    ??? Steatosis of liver 07/01/2014   ??? Thrombocytopenia (CMS-HCC)        ALLERGIES  Oxycodone and Mistletoe (viscum Jordan - from apple trees)      CURRENT MEDICATIONS  Current Outpatient Medications   Medication Sig Dispense Refill   ??? acetaminophen (TYLENOL) 325 MG tablet Take 2 tablets (650 mg total) by mouth every six (6) hours as needed. 30 tablet 0   ??? albuterol HFA 90 mcg/actuation inhaler Inhale 2 puffs every six (6) hours as needed for wheezing or shortness of breath. 8 g 3   ??? clobetasoL (TEMOVATE) 0.05 % cream Apply topically Two (2) times a day. 60 g 1   ??? clobetasoL (TEMOVATE) 0.05 % ointment Apply topically Two (2) times a day. 60 g 1   ??? famotidine (PEPCID) 20 MG tablet Take 20 mg by mouth Two (2) times a day.     ??? fluorouraciL (EFUDEX) 5 % cream Apply nightly to warts 40 g 2   ??? mycophenolate (CELLCEPT) 500 mg tablet Take 2 tablets (1,000mg  total) by mouth 2 times a day 120 tablet 3   ??? sertraline HCl (SERTRALINE ORAL) Take 150 mg by mouth daily.      ??? warfarin (COUMADIN) 2 MG tablet Take 2 mg by mouth daily with evening meal.     ??? warfarin (COUMADIN) 5 MG tablet Take 5 mg by mouth daily with evening meal.       Current Facility-Administered Medications   Medication Dose Route Frequency Provider Last Rate Last Admin   ??? levonorgestrel (MIRENA) 20 mcg/24 hr (5 years) IUD 1 kit  1 each Intrauterine Once Sydnee Levans, MD           FAMILY HISTORY  Negative for early CAD    SOCIAL HISTORY  She  reports that she has never smoked. She has never used smokeless tobacco. She reports current alcohol use. She reports that she does not use drugs.      REVIEW OF SYSTEMS    Review of Systems - 10 systems were reviewed and negative except as noted in HPI    Constitutional: negative for - chills, fatigue, fever or night sweats  ENT ROS: negative for - vertigo or visual changes  Hematological and Lymphatic ROS: negative for - blood transfusions, jaundice, night sweats or swollen lymph nodes  Endocrine ROS: negative for - skin changes or temperature  intolerance  Respiratory ROS: no cough, shortness of breath, or wheezing negative for - hemoptysis, orthopnea, shortness of breath, tachypnea or wheezing  Cardiovascular ROS:  As reported in HPI  Gastrointestinal ROS: negative for - abdominal pain, hematemesis, melena, nausea/vomiting or swallowing difficulty/pain  Genito-Urinary ROS: negative for - dysuria, incontinence or nocturia  Musculoskeletal ROS: negative for - gait disturbance, joint pain, muscle pain or muscular weakness  Neurological ROS: negative for - behavioral changes, bowel and bladder control changes, headaches, seizures or speech problems    PHYSICAL EXAM     Physical Exam  BP 103/61 (BP Site: L Arm, BP Position: Sitting, BP Cuff Size: Large)  - Pulse 71  - Ht 167.6 cm (5' 6)  - Wt (!) 112.8 kg (248 lb 9.6 oz)  - SpO2 98%  - BMI 40.13 kg/m??    Wt Readings from Last 3 Encounters:   06/25/20 (!) 112.8 kg (248 lb 9.6 oz)   05/19/20 (!) 113.4 kg (250 lb)   04/15/20 (!) 111.5 kg (245 lb 12.8 oz)       General:  Alert, no distress.   Eyes:  Intact, sclerae anicteric.   Ears, nose, mouth: Moist mucous membranes.Supple, no carotid bruit.    Respiratory:   CTAB bilaterally with normal WOB.   Cardiovascular:  No carotid bruit, no JVD, RRR no murmurs rubs or gallops   Gastrointestinal:   Normal bowel sounds, soft, NTND.   Musculoskeletal: Normal strength   Skin: Warm, well perfused.   Neurologic: No focal deficits.       Most recent labs   Lab Results   Component Value Date    Sodium 139 05/19/2020    Sodium 140 07/09/2014    Potassium 3.4 05/19/2020    Potassium 3.9 07/09/2014    Chloride 107 05/19/2020    Chloride 105 07/09/2014    CO2 22.0 05/19/2020    CO2 25 07/09/2014    BUN <5 (L) 05/19/2020    BUN 9 07/09/2014    Creatinine 0.57 (L) 05/19/2020    Creatinine 0.67 07/09/2014    Magnesium 2.0 12/26/2019    Magnesium 1.8 07/09/2014     Lab Results   Component Value Date    HGB 12.5 05/19/2020    HGB 12.3 07/09/2014    MCV 75.3 (L) 05/19/2020    MCV 83 07/09/2014    Platelet 206 05/19/2020    Platelet 192 07/09/2014     Lab Results   Component Value Date    Hemoglobin A1C 4.8 03/22/2019    Hemoglobin A1C 5.0 04/16/2013    TSH 2.277 12/24/2019    TSH 0.95 10/03/2012    PRO-BNP <11.1 09/01/2019    INR 2.46 05/19/2020    INR 1.2 07/02/2012       No results found for: CHOL  No results found for: HDL  No results found for: LDL  No results found for: VLDL  No results found for: CHOLHDLRATIO  No results found for: TRIG      *Patient note was created using dragon dictation. Any errors in syntax or proofreading may not have been identified and edited on initial review prior to signing this note.    Thank you very much for allowing me the opportunity to participate in the care of Melody Padilla, who is a delightful patient.  Please do not hesitate to call me if you have any questions.      Sincerely,    Jacquelyne Balint, MD, [facility], [facility]  Interventional Cardiology  Assistant  Professor of Medicine  [place] [facility] [employer] at [employer] Attestation:         This document serves as a record of the services and decisions performed by Jacquelyne Balint, MD, [facility] on 06/25/2020. It was created on his behalf by Belva Agee, a trained medical scribe. The creation of this document is based on the provider's statements and observations that were conveyed to the medical scribe during the patient's encounter.     (The information in this document, created by the medical scribe for me, accurately reflects the services I personally performed and the decisions made by me. I have reviewed and approved this document for accuracy.)     Jacquelyne Balint, MD,  [facility], [facility]  Interventional Cardiology  Associate Professor of Medicine  [place] [facility] [place] at [facility]

## 2020-06-26 NOTE — Unmapped (Signed)
DIVISION OF CARDIOLOGY  [facility] [place] [place]        Date of Service: 06/25/2020      PCP: Referring Provider:   Tyson Babinski [facility]  [address] [place] [facility]  [place] [address]  Phone: [phone]  Fax: [phone] [facility]  [address] [facility]  [place],  [place] [address]  Phone: [phone]  Fax: [phone]     ______________________________________________________________________________________________      ASSESSMENT AND PLAN:   1. Dyspnea  - Likely multifactorial from hx COVID-19, untreated OSA and deconditioning. Since last visit, pt had normal cardiac MRI and V/Q scan which were reassuring against chronic PE contributing to symptoms.   - Continue to monitor, assess improvement with appropriately treated OSA at follow up.    2. Hx OSA not on CPAP  - Pt counseled on importance of CPAP treatment adherence.     3. Hx Evan's syndrome, anti-phospholipid syndrome  - On warfarin, plaquenil, rituximab  - Pt follows with benign heme and rheumatology, endorses good medication adherence at this time.      Jacquelyne Balint, MD,  [facility], [facility]  Interventional Cardiology  Associate Professor of Medicine  [place] [facility] at [facility]    ______________________________________________________________________________________________      SUBJECTIVE:     HISTORY OF PRESENT ILLNESS:    Dear Dr. [facility]  [address]  [place] [facility]  [place],  [place] [address],      I had the pleasure of seeing Melody Padilla in our adult general cardiology clinic today for follow up visit.  She is a 25 y.o. year-old patient with the above mentioned diagnosis who presents for follow up of her coronary disease and secondary risk factor modification.  From a cardiovascular standpoint, She is doing relatively well. Notes she still has some shortness of breath when doing cardio workouts, as well as dizziness. These resolve with rest. She does note her intermittent chest pain symptoms have resolved. She also endorses waking up at night feeling short of breath, as well as difficulty sleeping in general. She has a history of sleep apnea, but has not used CPAP in over a year as she previously felt her symptoms were well controlled at that time. She also notes she had COVID-19 in January and had cough, shortness of breath with this which have mostly improved at this time.    She has had a repeat CT scan without acute PEs, and a V/Q scan without evidence of chronic thromboembolic disease.    She reports no lightheadedness, palpitations, or syncope.  Patient also denies any overt heart failure symptoms such as orthopnea, paroxysmal nocturnal dyspnea, or worsening lower extremity edema.      Melody Padilla states compliance with her medications and denies any untoward side effects from it.      From a functional standpoint, Melody Padilla states that her is doing relatively well and is able to walk about 1 mile without any significant difficulties.  She states that she is able to do all their activities of daily living without any problems and there hasn't been any change in their functional status since the last clinic visit.      CARDIOVASCULAR HISTORY AND PROCEDURES    Cardiovascular Studies Date Comments     ECG 2021 NSR   Echo 2021    Stress test 2021  Cardiac catheterization 2021    Electrophysiology      Cardiovascular Surgery     Peripheral Vascular Studies         I have reviewed the cardiology tests personally.      PAST MEDICAL HISTORY  Past Medical History:   Diagnosis Date   ??? Acute deep vein thrombosis (DVT) of femoral vein of right lower extremity (CMS-HCC) 10/12/2016   ??? Acute pulmonary embolism (CMS-HCC) 10/12/2016   ??? Anxiety    ??? Clotting disorder (CMS-HCC)    ??? CTS (carpal tunnel syndrome)    ??? Depression complicating pregnancy, antepartum 10/10/2013   ??? Eczema    ??? Evan's syndrome (CMS-HCC) ??? Evan's syndrome (CMS-HCC)    ??? Fractures     left wrist   ??? Joint pain    ??? Lupus (CMS-HCC)    ??? Migraine    ??? Obesity    ??? Psoriasis    ??? Steatosis of liver 07/01/2014   ??? Thrombocytopenia (CMS-HCC)        ALLERGIES  Oxycodone and Mistletoe (viscum Jordan - from apple trees)      CURRENT MEDICATIONS  Current Outpatient Medications   Medication Sig Dispense Refill   ??? acetaminophen (TYLENOL) 325 MG tablet Take 2 tablets (650 mg total) by mouth every six (6) hours as needed. 30 tablet 0   ??? albuterol HFA 90 mcg/actuation inhaler Inhale 2 puffs every six (6) hours as needed for wheezing or shortness of breath. 8 g 3   ??? clobetasoL (TEMOVATE) 0.05 % cream Apply topically Two (2) times a day. 60 g 1   ??? clobetasoL (TEMOVATE) 0.05 % ointment Apply topically Two (2) times a day. 60 g 1   ??? famotidine (PEPCID) 20 MG tablet Take 20 mg by mouth Two (2) times a day.     ??? fluorouraciL (EFUDEX) 5 % cream Apply nightly to warts 40 g 2   ??? hydrOXYchloroQUINE (PLAQUENIL) 200 mg tablet Take 2 tablets (400 mg total) by mouth daily. 180 tablet 3   ??? mycophenolate (CELLCEPT) 500 mg tablet Take 2 tablets (1,000mg  total) by mouth 2 times a day 120 tablet 3   ??? sertraline HCl (SERTRALINE ORAL) Take 150 mg by mouth daily.      ??? warfarin (COUMADIN) 2 MG tablet Take 2 mg by mouth daily with evening meal.     ??? warfarin (COUMADIN) 5 MG tablet Take 5 mg by mouth daily with evening meal.       Current Facility-Administered Medications   Medication Dose Route Frequency Provider Last Rate Last Admin   ??? levonorgestrel (MIRENA) 20 mcg/24 hr (5 years) IUD 1 kit  1 each Intrauterine Once Sydnee Levans, MD           FAMILY HISTORY  Negative for early CAD    SOCIAL HISTORY  She  reports that she has never smoked. She has never used smokeless tobacco. She reports current alcohol use. She reports that she does not use drugs.      REVIEW OF SYSTEMS    Review of Systems - 10 systems were reviewed and negative except as noted in HPI    Constitutional: negative for - chills, fatigue, fever or night sweats  ENT ROS: negative for - vertigo or visual changes  Hematological and Lymphatic ROS: negative for - blood transfusions, jaundice, night sweats or swollen lymph nodes  Endocrine ROS: negative for - skin changes or temperature intolerance  Respiratory ROS: no cough, shortness of breath, or wheezing  negative for - hemoptysis, orthopnea, shortness of breath, tachypnea or wheezing  Cardiovascular ROS:  As reported in HPI  Gastrointestinal ROS: negative for - abdominal pain, hematemesis, melena, nausea/vomiting or swallowing difficulty/pain  Genito-Urinary ROS: negative for - dysuria, incontinence or nocturia  Musculoskeletal ROS: negative for - gait disturbance, joint pain, muscle pain or muscular weakness  Neurological ROS: negative for - behavioral changes, bowel and bladder control changes, headaches, seizures or speech problems    PHYSICAL EXAM     Physical Exam  BP 103/61 (BP Site: L Arm, BP Position: Sitting, BP Cuff Size: Large)  - Pulse 71  - Ht 167.6 cm (5' 6)  - Wt (!) 112.8 kg (248 lb 9.6 oz)  - SpO2 98%  - BMI 40.13 kg/m??    Wt Readings from Last 3 Encounters:   06/25/20 (!) 112.8 kg (248 lb 9.6 oz)   05/19/20 (!) 113.4 kg (250 lb)   04/15/20 (!) 111.5 kg (245 lb 12.8 oz)       General:  Alert, no distress.   Eyes:  Intact, sclerae anicteric.   Ears, nose, mouth: Moist mucous membranes.Supple, no carotid bruit.    Respiratory:   CTAB bilaterally with normal WOB.   Cardiovascular:  No carotid bruit, no JVD, RRR no murmurs rubs or gallops   Gastrointestinal:   Normal bowel sounds, soft, NTND.   Musculoskeletal: Normal strength   Skin: Warm, well perfused.   Neurologic: No focal deficits.       Most recent labs   Lab Results   Component Value Date    Sodium 139 05/19/2020    Sodium 140 07/09/2014    Potassium 3.4 05/19/2020    Potassium 3.9 07/09/2014    Chloride 107 05/19/2020    Chloride 105 07/09/2014    CO2 22.0 05/19/2020    CO2 25 07/09/2014    BUN <5 (L) 05/19/2020    BUN 9 07/09/2014    Creatinine 0.57 (L) 05/19/2020    Creatinine 0.67 07/09/2014    Magnesium 2.0 12/26/2019    Magnesium 1.8 07/09/2014     Lab Results   Component Value Date    HGB 12.5 05/19/2020    HGB 12.3 07/09/2014    MCV 75.3 (L) 05/19/2020    MCV 83 07/09/2014    Platelet 206 05/19/2020    Platelet 192 07/09/2014     Lab Results   Component Value Date    Hemoglobin A1C 4.8 03/22/2019    Hemoglobin A1C 5.0 04/16/2013    TSH 2.277 12/24/2019    TSH 0.95 10/03/2012    PRO-BNP <11.1 09/01/2019    INR 2.46 05/19/2020    INR 1.2 07/02/2012     Melody Padilla, MS4  [facility] [employer]    No results found for: CHOL  No results found for: HDL  No results found for: LDL  No results found for: VLDL  No results found for: CHOLHDLRATIO  No results found for: TRIG      *Patient note was created using dragon dictation. Any errors in syntax or proofreading may not have been identified and edited on initial review prior to signing this note.    Thank you very much for allowing me the opportunity to participate in the care of Melody Padilla, who is a delightful patient.  Please do not hesitate to call me if you have any questions.      Sincerely,    Jacquelyne Balint, MD, [facility], [facility]  Interventional Cardiology  Associate Professor  of Medicine  [place] [facility] [place] at [facility]

## 2020-06-27 NOTE — Unmapped (Addendum)
Recommend OSA therapy and conditioning with exercise.

## 2020-06-28 NOTE — Unmapped (Signed)
[facility] Refill Coordination Note    Specialty Medication(s) to be Shipped:   Inflammatory Disorders: mycophenolate    Other medication(s) to be shipped: No additional medications requested for fill at this time     Melody Padilla, DOB: 06-02-1995  Phone: [phone] (home)       All above HIPAA information was verified with patient.     Was a Nurse, learning disability used for this call? No    Completed refill call assessment today to schedule patient's medication shipment from the [facility] [phone]).       Specialty medication(s) and dose(s) confirmed: Regimen is correct and unchanged.   Changes to medications: Timmia reports no changes at this time.  Changes to insurance: No  Questions for the pharmacist: Yes: patient missed several morning doses.    Confirmed patient received Conservation officer, historic buildings with first shipment. The patient will receive a drug information handout for each medication shipped and additional FDA Medication Guides as required.       DISEASE/MEDICATION-SPECIFIC INFORMATION        N/A    SPECIALTY MEDICATION ADHERENCE     Medication Adherence    Patient reported X missed doses in the last month: 6  Specialty Medication: mycophenolate 500mg   Patient is on additional specialty medications: No                mycophenolate 500 mg: 4 days of medicine on hand          SHIPPING     Shipping address confirmed in Epic.     Delivery Scheduled: Yes, Expected medication delivery date: 06/30/20.     Medication will be delivered via Next Day Courier to the prescription address in Epic WAM.    Unk Lightning   [facility] Specialty Technician

## 2020-06-29 MED FILL — MYCOPHENOLATE MOFETIL 500 MG TABLET: ORAL | 30 days supply | Qty: 120 | Fill #3

## 2020-07-04 DIAGNOSIS — D6941 Evans syndrome: Principal | ICD-10-CM

## 2020-07-04 DIAGNOSIS — D590 Drug-induced autoimmune hemolytic anemia: Principal | ICD-10-CM

## 2020-07-04 DIAGNOSIS — D6861 Antiphospholipid syndrome: Principal | ICD-10-CM

## 2020-07-10 IMAGING — US US EXTREM LOW VENOUS*R*
1 series · 13 of 24 positions shown · non-contrast
Comparison: None.

CLINICAL DATA: Right knee and leg pain with edema, prior DVT and
PE.



[Series 1: us extrem low venous*right* · 13 of 33 slices shown]
[im 1/33]
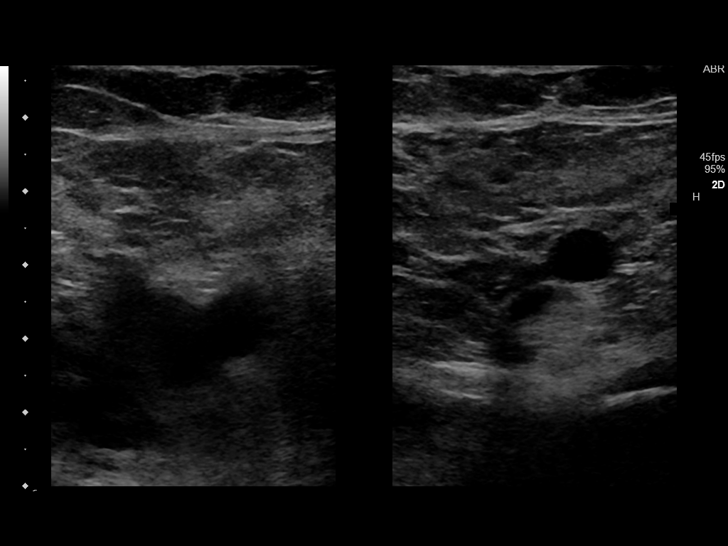
[im 3/33]
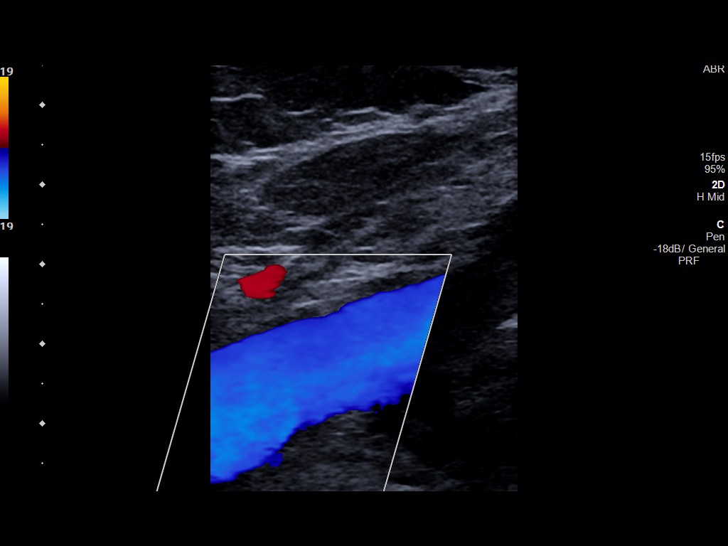
[im 6/33]
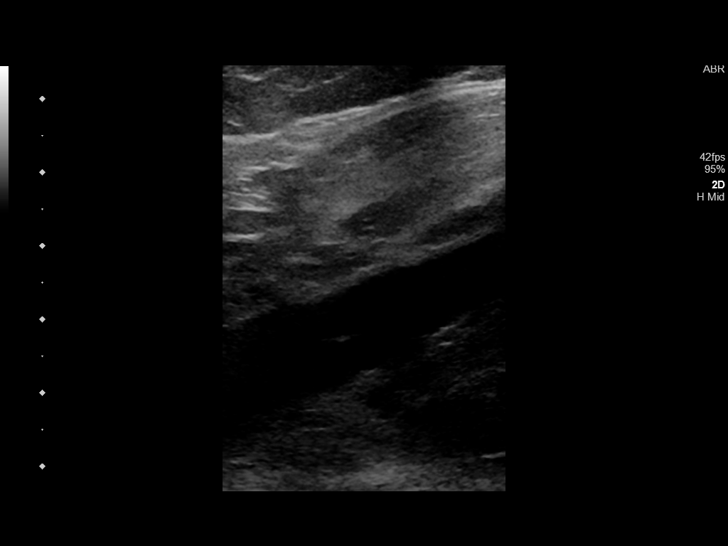
[im 9/33]
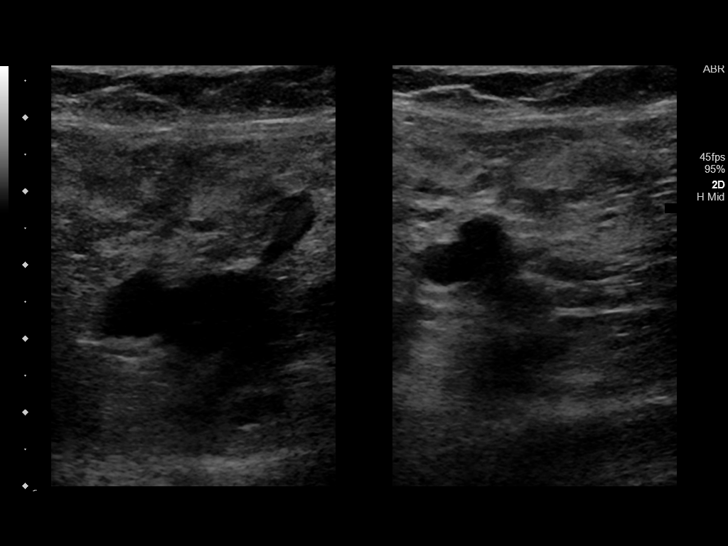
[im 12/33]
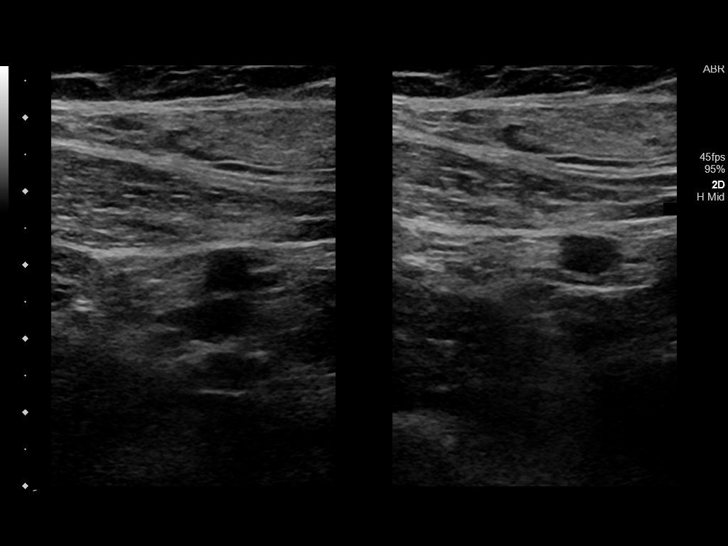
[im 14/33]
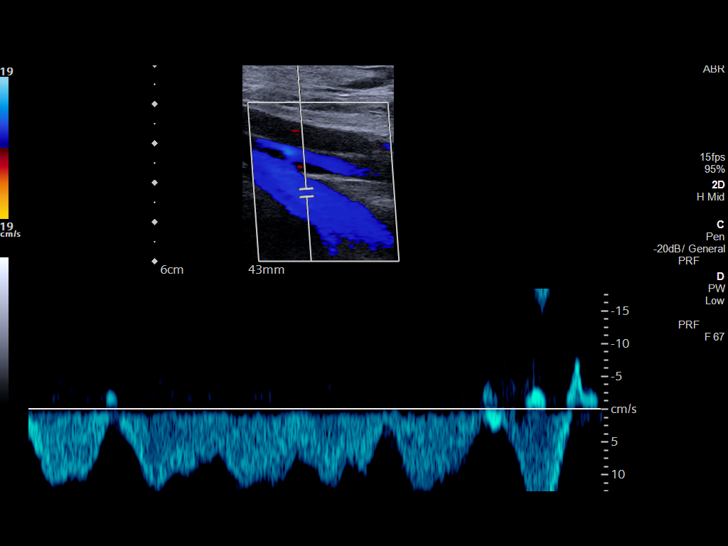
[im 17/33]
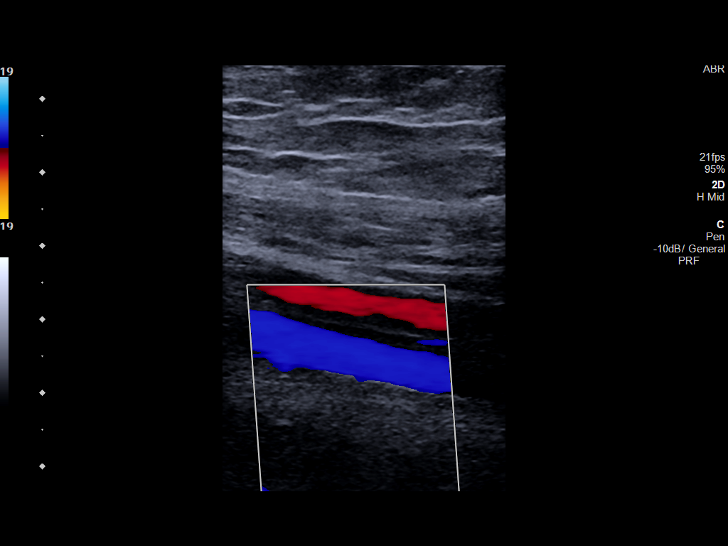
[im 19/33]
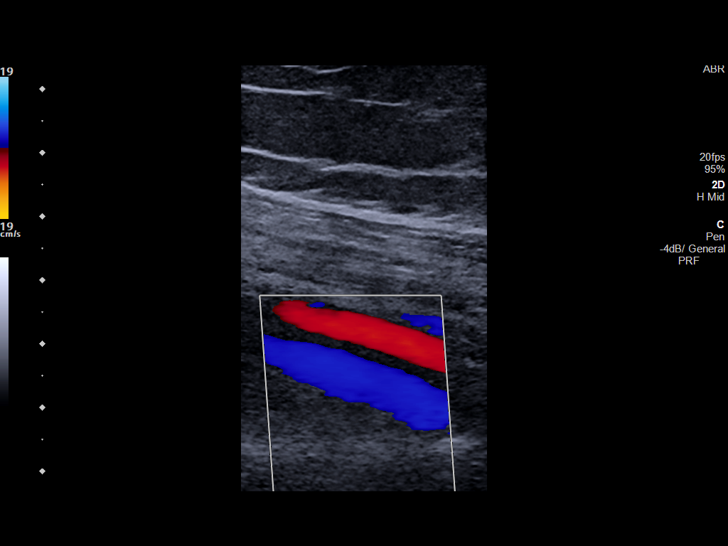
[im 21/33]
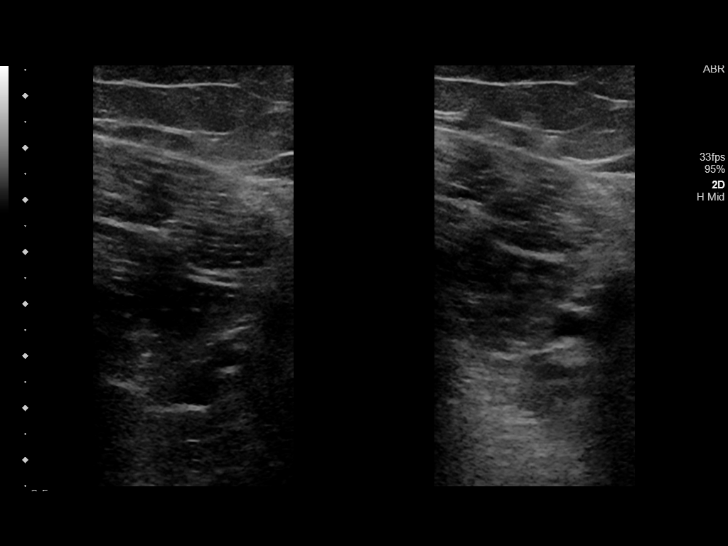
[im 24/33]
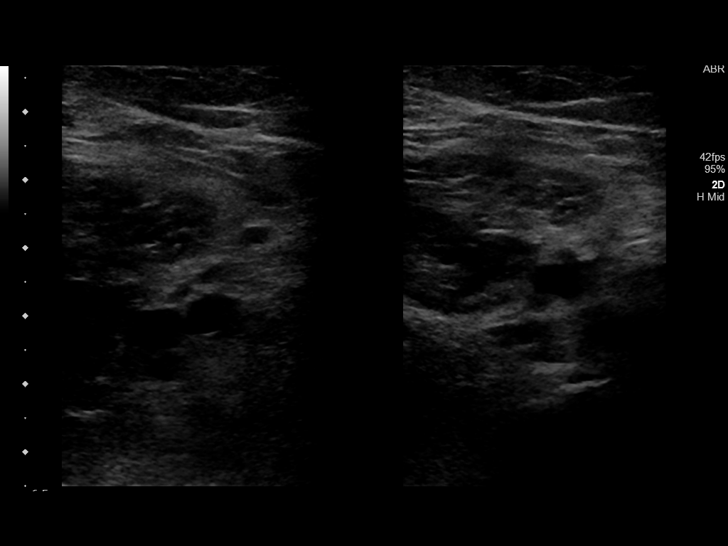
[im 27/33]
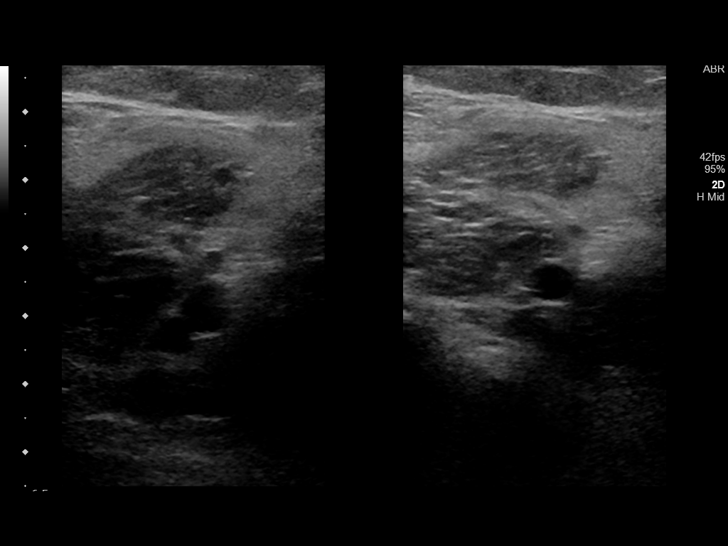
[im 30/33]
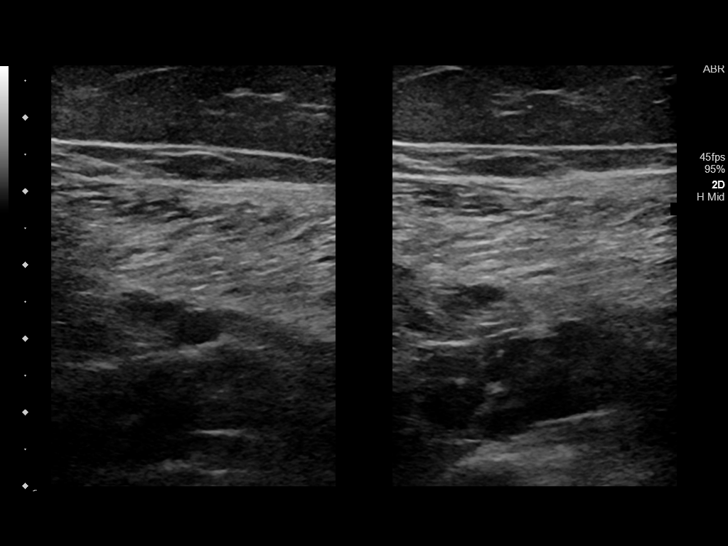
[im 33/33]
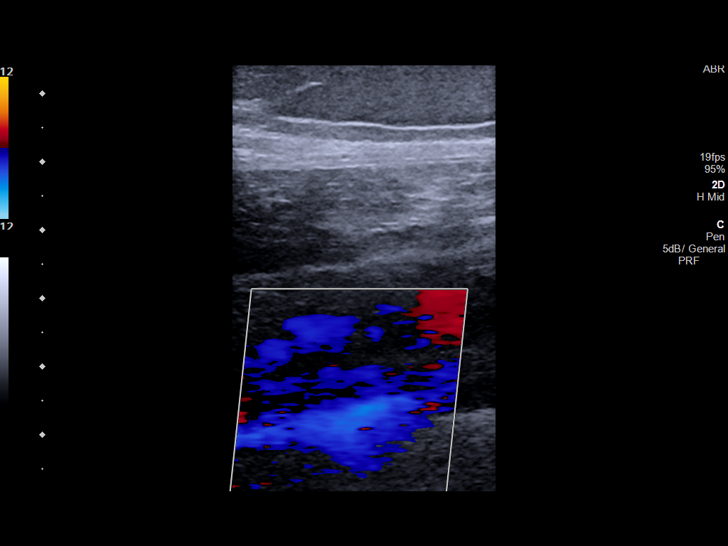

[13 of 24 positions shown; findings below may reference images not displayed]

FINDINGS: Contralateral Common Femoral Vein: Respiratory phasicity is normal
and symmetric with the symptomatic side. No evidence of thrombus.
Normal compressibility.

Common Femoral Vein: No evidence of thrombus. Normal
compressibility, respiratory phasicity and response to augmentation.

Saphenofemoral Junction: No evidence of thrombus. Normal
compressibility and flow on color Doppler imaging.

Profunda Femoral Vein: No evidence of thrombus. Normal
compressibility and flow on color Doppler imaging.

Femoral Vein: No evidence of thrombus. Normal compressibility,
respiratory phasicity and response to augmentation.

Popliteal Vein: No evidence of thrombus. Normal compressibility,
respiratory phasicity and response to augmentation.

Calf Veins: No evidence of thrombus. Normal compressibility and flow
on color Doppler imaging.

Superficial Great Saphenous Vein: No evidence of thrombus. Normal
compressibility.

Venous Reflux:  None.

Other Findings:  None.
IMPRESSION: No evidence of deep venous thrombosis.

## 2020-07-15 ENCOUNTER — Ambulatory Visit: Admit: 2020-07-15 | Discharge: 2020-07-16 | Payer: MEDICARE | Attending: Internal Medicine | Primary: Internal Medicine

## 2020-07-15 DIAGNOSIS — D500 Iron deficiency anemia secondary to blood loss (chronic): Principal | ICD-10-CM

## 2020-07-15 DIAGNOSIS — D6861 Antiphospholipid syndrome: Principal | ICD-10-CM

## 2020-07-15 DIAGNOSIS — D6941 Evans syndrome: Principal | ICD-10-CM

## 2020-07-15 NOTE — Unmapped (Signed)
Hematology Clinic Visit    PCP: [facility]     Reason for visit: Follow-up Melody Padilla    Assessment and Recommendations:  Melody Padilla is a 25 y.o. female with Melody Padilla who presents for follow-up.     1. Iron deficiency anemia:  Likely due to heavy menstrual bleeding and was recently hospitalized for the same.   Continues to have microcytic anemia.   Missed last period, just some spotting - has mirena - placed in June 2021.   Will set up iron infusion but switch to ferrlicit given the reaction to Infed in October. I have ordered this today.     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    Has experienced multiple flares over the last 2 years.    (i) She was hospitalized 12/2017 with WAIHA flare.  Her platelets on admission were wnl. She received methylprednisolone 1 g x 3 doses (01/27/18 - 01/29/18), Rituximab 375 mg/m^2 (x4 01/27/18-02/25/18). She had a prolonged steroid taper and was started on Azathioprine as a steroid sparing agent by Rheumatology.   (ii) Melody flares in October and November 2020 treated with 4 days of IVIG, 4 days of high-dose dexamethasone each time, and 1 g rituximab x2 doses (completed 02/27/2019).  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     Doing well from the Melody syndrome standpoint.       3. 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   - Continue to follow with PCP for this.     4. SLE  Primary manifestations include joint pains.  She follows with [facility] (Dr. Ilsa Iha).  She is currently on Plaquenil and Cellcept.   On Rituxan q6 months for maintenance. Next dose due in May 2022.     5. COVID x 2 in 1 year.   Had received caccine x 2 doses but no booster.   Advised her to proceed with booster now. I worry she may not be responding to vaccines given the rituxan treatment.   Should consider Evushield.      Will schedule follow up visit with hematology in 3 months.       -------------------------------    History of Present Illness:  Melody Padilla is a 25 y.o. Hispanic woman with SLE, triple positive APLAS (on warfarin) and Evans syndrome (WAIHA + ITP) who is being seen at the hematology clinic today for follow-up.  I last saw Melody Padilla in clinic in December 2021. Kindly refer to my clinic note for full details.     Background history:  Melody Padilla was hospitalized at [facility] from June 13-21, 2018.  She was admitted for evaluation of right lower extremity and groin pain and found to have ileofemoral DVT and pulmonary embolism.  She was initiated on anticoagulation and has remained on this following confirmation of the diagnosis of antiphospholipid syndrome.  ??  Melody Padilla was subsequently hospitalized from 01/25/18 - 01/29/18 with WAIHA flare.  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  Her platelets were appropriate at that time.  She received IV methylpred for 3 days from 9/29 - 10/1.  She also started on Rituximab 9/29  with plan for weekly infusion.  Her Hgb on day of discharge was 9.2.  Bilirubin trended down to 3.1, and LDH to 1002 on day of discharge.   ??  At the time of her follow-up visit with me in June 2020, Melody Padilla reported being well since discharge from the hospital.  She was started on Azathiprine by Dr.Snyder. She was tolerating this well. She was evaluated at a local ED in April 2020 for left sided non-pleuritic chest pain and reportedly had a negative cardiac and PE work up (we do not have the results).  3 weeks earlier she noted increasing fatigue, lightheadedness and occasional bruising that was more excessive than what she has had with her INR being therapeutic. She was not using NSAIDs and no over the counter medications and apart from a recent 4 day period where she ran out of her medications, she was very compliant.  I recommended continuing the current treatment plan at that time.     [name] was found to have a flare of Melody syndrome in the later part of September 2020.  Platelets progressively decreased and were 30,000 by February 03, 2019.  I advised her to hold anticoagulation and started her on prednisone 40 mg daily but her thrombocytopenia continue to progress and she was hospitalized at [facility] from 10/12-10/24/2020 for management.  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  She was treated with a 4-day course of IVIG, 4 days of high-dose dexamethasone, and dose number 1 out of 2 of 1 g rituximab (02/11/2019).  Platelets 117,000 on discharge.  Anticoagulation was resumed with warfarin on Lovenox bridge.  ??  At the time of her visit with me in early November 2020, Melody Padilla was continuing to bridge with Lovenox while awaiting therapeutic INR on warfarin.  Given extensive bruising over her abdominal wall from the Lovenox injections, I have recommended that she stop the Lovenox and just take the warfarin.  She reported continue to feel fatigued and difficulty with insomnia.  She was not discharged on any steroids.  She had the second dose of rituximab at the hematology clinic on 02/27/2019.  CBC at that time showed normal hemoglobin, platelets of 96,000.  INR was 2.29.  I recommended continuing weekly CBCs, which she typically had drawn at the [facility] clinic.  ??  Unfortunately, Melody Padilla was again hospitalized from November 21 to March 25, 2019 for flare of ITP.  She developed epistaxis x3 and some gingival bleeding and labs revealed a platelet count of 9.  She reported to [facility] ER and was hospitalized.  She was treated with steroids and IVIG. Home azathioprine and plaquenil were continued throughout admission per rheumatology.  She was discharged on a 4-week taper of prednisone starting at 40 mg once daily.  Platelet count on day of discharge is 103,000.  She was discharged on Metformin with instructions to take it as long as she was taking prednisone.  This was due to elevated blood sugars observed during the hospitalization while on steroids.  Melody Padilla reports doing well since discharge from the bleeding standpoint.  This week is the last of her prednisone taper.  She is understandably concerned about whether she would relapse now that she is coming off steroids.  She has gained about 10 pounds over the last 2 months from repeated courses of steroids.  She remains on warfarin.  She has not had any  labs in almost 2 weeks.  Last platelet count was greater than 200,000 and INR was therapeutic.  She continues to have some shortness of breath with ADLs and minimal exertion.  This has been ongoing for a number of months now.      Melody Padilla was sick with COVID-19 in June.     She was hospitalized from 7/6-11/06/2019 with heavy menstrual bleeding. Hgb was at baseline 12.2 with normal platelets. INR was 6.95 and she was administered vitamin K and discharged.    She saw ID on 11/12/2019 & dermatology on 11/19/19 regarding skin lesions which were assessed to be likely cutaneous lupus vs eczematous dermatitis.     End of July 2021 she hit her head on the car frame as she was entering and had a severe headache. She went to the ED but left before evaluation as the wait was taking too long. Her headache has since improved substantially and she denies any vision change, tinnitus, nausea/vomiting, or weakness.     [place]ent [place] [employer] trip in [place]. Uneventful trip.     Developed persistent joint pains and chest pain in August 2021 - admitted to [facility]  Underwent extensive evaluation -- thought to be related to lupus and possibility of type 2 MI raised as well. She did not undergo cardiac cath as the likelihood of finding significant coronary disease is low given age. INR was subtherapeutic upon admission and she was bridged with lovenox.     Scheduled for iron infusion following last visit with me. Received it on 02/04/20 but aborted as she developed chest pain 10 mins into the test dose. Required epi-pen.     At the time of her visit in Dec 2021, Melody Padilla reported feeling very tired. Gets dizzy and suffers HAs multiple days a week.  She was in the ER the day before visit for bleeding from the toes - INR at 6.45. She reported continued episodes of CPs 1-2x/day. Last 10 secs or so, spontaneously resolve. Has had work-up for this in the past, all negative. Gained ~ 10 lbs over the last 3 months.  Attributed this to decreased activity (fatigue) and diet.     Interval history and Review of Systems:  Melody Padilla was in the ER for sore throat and shortness of breath in Jan 2022.  Work-up included CTA - no new PEs. Was found to have COVID. Lasted ~ 10 days. The SOB persisted for weeks after. Feels she is still recovering.   Has CPAP - was not using, will resume.   Doing well from the anticoagulation standpoint.  INR last week was 2.5.   Very tired the last week, also HAs including in clinic today.   Started going to the gym - skipped this week given fatigue and mood is also down, feels may be her depression.   Remains on plaquenil and cellcept.  Started famotidine and sertraline this month - had stopped in Nov.   A 12 systems ROS was otherwise negative.       Past Medical History:   Diagnosis Date   ??? Acute deep vein thrombosis (DVT) of femoral vein of right lower extremity (CMS-HCC) 10/12/2016   ??? Acute pulmonary embolism (CMS-HCC) 10/12/2016   ??? Anxiety    ??? Clotting disorder (CMS-HCC)    ??? CTS (carpal tunnel syndrome)    ??? Depression complicating pregnancy, antepartum 10/10/2013   ??? Eczema    ??? Melody syndrome (CMS-HCC)    ??? Melody syndrome (  CMS-HCC)    ??? Fractures     left wrist   ??? Joint pain    ??? Lupus (CMS-HCC)    ??? Migraine    ??? Obesity    ??? Psoriasis    ??? Steatosis of liver 07/01/2014   ??? Thrombocytopenia (CMS-HCC)        Family History   Problem Relation Age of Onset   ??? Diabetes Mother    ??? Hypertension Mother    ??? Cancer Mother    ??? Depression Mother    ??? Diabetes Maternal Grandmother    ??? Cancer Maternal Grandmother    ??? Hearing loss Paternal Grandmother    ??? Arthritis Cousin    ??? Asthma Cousin    ??? No Known Problems Father    ??? No Known Problems Sister    ??? No Known Problems Brother    ??? No Known Problems Maternal Aunt    ??? No Known Problems Maternal Uncle    ??? No Known Problems Paternal Aunt    ??? No Known Problems Paternal Uncle    ??? No Known Problems Maternal Grandfather    ??? No Known Problems Paternal Grandfather    ??? No Known Problems Other    ??? Blindness Neg Hx    ??? Melanoma Neg Hx    ??? Basal cell carcinoma Neg Hx    ??? Squamous cell carcinoma Neg Hx    ??? Anesthesia problems Neg Hx    ??? Broken bones Neg Hx    ??? Clotting disorder Neg Hx    ??? Collagen disease Neg Hx    ??? Dislocations Neg Hx    ??? Fibromyalgia Neg Hx    ??? Gout Neg Hx    ??? Hemophilia Neg Hx    ??? Osteoporosis Neg Hx    ??? Rheumatologic disease Neg Hx    ??? Scoliosis Neg Hx    ??? Severe sprains Neg Hx    ??? Sickle cell anemia Neg Hx    ??? Spinal Compression Fracture Neg Hx    ??? Glaucoma Neg Hx      Social History  Socioeconomic History   ??? Marital status: Single     Spouse name: Not on file   ??? Number of children: Not on file   ??? Years of education: Not on file   ??? Highest education level: Not on file   Occupational History   ??? 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 Smoking status: Never Smoker   ??? Smokeless tobacco: Never Used   Vaping Use   ??? Vaping Use: Never used   Substance and Sexual Activity   ??? Alcohol use: Yes     Comment: Occasionally   2 x  month    ??? Drug use: No   ??? Sexual activity: Yes     Partners: Male     Birth control/protection: I.U.D.   Other Topics Concern   ??? Do you use sunscreen? Yes   ??? Tanning bed use? No   ??? Are you easily burned? No   ??? Excessive sun exposure? No   ??? Blistering sunburns? No   Social History Narrative    Lives with parents.  Has a son who is 76 years old.      Allergies:  Allergies   Allergen Reactions   ??? Oxycodone Shortness Of Breath, Itching and Rash   ??? Mistletoe ([employer] - From [employer])  Allergic to All Types of tree's     Medications:  Melody Padilla has a current medication list which includes the following prescription(s): acetaminophen, albuterol, famotidine, hydroxychloroquine, mycophenolate, sertraline hcl, warfarin, clobetasol, clobetasol, fluorouracil, and warfarin, and the following Facility-Administered Medications: levonorgestrel.     Physical Exam:  VITALS: BP 122/70 (BP Site: L Arm, BP Position: Sitting)  - Pulse 62  - Temp 36.3 ??C (97.3 ??F)  - Ht 167.6 cm (5' 5.98)  - Wt (!) 112.6 kg (248 lb 3.2 oz)  - SpO2 98%  - BMI 40.08 kg/m??   GEN: Well appearing, no acute distress  HEENT: Pupils equally round, sclerae anicteric, conjunctiva clear, moist mucous membranes, no oral lesions or exudates  NECK: Supple, no JVD  HEME/LYMPH: No palpable cervical or supraclavicular lymphadenopathy  EXT:  No bony pain or tenderness. No lower extremity edema  NEURO: Alert and oriented, speech intact, following commands, moving all extremities well, no focal deficits appreciated  PSYCH: Normal mood and appropriate affect    Test Results:     05/20/2020: CTA chest:  Evaluation of the distal pulmonary arteries is limited due to poor contrast opacification. Within these limits there is no evidence of large acute central pulmonary embolism.  Questionable residual chronic embolus in the left lower lobe segmental/subsegmental arteries, although evaluation is limited by contrast timing.  Faint focus of groundglass opacity in the right upper lobe which may represent atelectasis, inflammation, or infection. Attention on follow-up.      Results for orders placed or performed during the hospital encounter of 05/19/20   RAPID INFLUENZA/RSV/COVID PCR    Specimen: Nasopharyngeal Swab   Result Value Ref Range    SARS-CoV-2 PCR Positive (A) Negative    Influenza A Negative Negative    Influenza B Negative Negative    RSV Negative Negative    SARS-CoV-2 CT Value 18.4    Comprehensive metabolic panel   Result Value Ref Range    Sodium 139 135 - 145 mmol/L    Potassium 3.4 3.4 - 4.5 mmol/L    Chloride 107 98 - 107 mmol/L    Anion Gap 10 5 - 14 mmol/L    CO2 22.0 20.0 - 31.0 mmol/L    BUN <5 (L) 9 - 23 mg/dL    Creatinine 1.61 (L) 0.60 - 0.80 mg/dL    EGFR CKD-EPI Non-African American, Female >90 >=60 mL/min/1.48m2    EGFR CKD-EPI African American, Female >90 >=60 mL/min/1.69m2    Glucose 128 70 - 179 mg/dL    Calcium 9.9 8.7 - 09.6 mg/dL    Albumin 4.1 3.4 - 5.0 g/dL    Total Protein 7.4 5.7 - 8.2 g/dL    Total Bilirubin 1.1 0.3 - 1.2 mg/dL    AST 43 (H) <=04 U/L    ALT 50 (H) 10 - 49 U/L    Alkaline Phosphatase 67 46 - 116 U/L   hsTroponin I (single, no delta)   Result Value Ref Range    hsTroponin I <3 <=34 ng/L   Pregnancy, hCG QUANTitative, Serum   Result Value Ref Range    hCG Quantitative <2.6 mIU/mL   PT-INR   Result Value Ref Range    PT 28.5 (H) 10.3 - 13.4 sec    INR 2.46    APTT   Result Value Ref Range    APTT 120.3 (H) 24.9 - 36.9 sec    Heparin Correlation 0.7    ECG 12 Lead   Result Value Ref Range  EKG Systolic BP  mmHg    EKG Diastolic BP  mmHg    EKG Ventricular Rate 88 BPM    EKG Atrial Rate 88 BPM    EKG P-R Interval 150 ms    EKG QRS Duration 96 ms    EKG Q-T Interval 378 ms    EKG QTC Calculation 457 ms    EKG Calculated P Axis 16 degrees    EKG Calculated R Axis -7 degrees    EKG Calculated T Axis 27 degrees    QTC Fredericia 429 ms   CBC w/ Differential   Result Value Ref Range    WBC 5.1 4.5 - 11.0 10*9/L    RBC 4.96 4.00 - 5.20 10*12/L    HGB 12.5 12.0 - 16.0 g/dL    HCT 32.4 40.1 - 02.7 %    MCV 75.3 (L) 80.0 - 100.0 fL    MCH 25.2 (L) 26.0 - 34.0 pg    MCHC 33.5 31.0 - 37.0 g/dL    RDW 25.3 (H) 66.4 - 15.0 %    MPV 11.0 (H) 7.0 - 10.0 fL    Platelet 206 150 - 440 10*9/L    Neutrophils % 76.6 %    Lymphocytes % 8.6 %    Monocytes % 7.1 %    Eosinophils % 3.8 %    Basophils % 0.6 %    Neutrophil Left Shift 1+ (A) Not Present    Absolute Neutrophils 3.9 2.0 - 7.5 10*9/L    Absolute Lymphocytes 0.4 (L) 1.5 - 5.0 10*9/L    Absolute Monocytes 0.4 0.2 - 0.8 10*9/L    Absolute Eosinophils 0.2 0.0 - 0.4 10*9/L    Absolute Basophils 0.0 0.0 - 0.1 10*9/L    Large Unstained Cells 3 0 - 4 %    Microcytosis Marked (A) Not Present    Anisocytosis Slight (A) Not Present    Hypochromasia Slight (A) Not Present   Morphology Review   Result Value Ref Range    Smear Review Comments See Comment (A) Undefined    Toxic Vacuolation Present (A) Not Present

## 2020-07-21 DIAGNOSIS — M328 Other forms of systemic lupus erythematosus: Principal | ICD-10-CM

## 2020-07-21 MED ORDER — MYCOPHENOLATE MOFETIL 500 MG TABLET
ORAL_TABLET | Freq: Two times a day (BID) | ORAL | 3 refills | 30 days
Start: 2020-07-21 — End: 2020-08-20

## 2020-07-21 NOTE — Unmapped (Signed)
[facility] Clinical Assessment & Refill Coordination Note    Melody Padilla, [place]: 23-Jul-1995  Phone: [phone] (home)     All above HIPAA information was verified with patient.     Was a Nurse, learning disability used for this call? No    Specialty Medication(s):   Inflammatory Disorders: mycophenolate     Current Outpatient Medications   Medication Sig Dispense Refill   ??? acetaminophen (TYLENOL) 325 MG tablet Take 2 tablets (650 mg total) by mouth every six (6) hours as needed. 30 tablet 0   ??? albuterol HFA 90 mcg/actuation inhaler Inhale 2 puffs every six (6) hours as needed for wheezing or shortness of breath. 8 g 3   ??? clobetasoL (TEMOVATE) 0.05 % cream Apply topically Two (2) times a day. (Patient not taking: Reported on 07/15/2020) 60 g 1   ??? clobetasoL (TEMOVATE) 0.05 % ointment Apply topically Two (2) times a day. (Patient not taking: Reported on 07/15/2020) 60 g 1   ??? famotidine (PEPCID) 20 MG tablet Take 20 mg by mouth Two (2) times a day.     ??? fluorouraciL (EFUDEX) 5 % cream Apply nightly to warts (Patient not taking: Reported on 07/15/2020) 40 g 2   ??? mycophenolate (CELLCEPT) 500 mg tablet Take 2 tablets (1,000mg  total) by mouth 2 times a day 120 tablet 3   ??? sertraline HCl (SERTRALINE ORAL) Take 150 mg by mouth daily.      ??? warfarin (COUMADIN) 2 MG tablet Take 2 mg by mouth daily with evening meal.     ??? warfarin (COUMADIN) 5 MG tablet Take 5 mg by mouth daily with evening meal.       Current Facility-Administered Medications   Medication Dose Route Frequency Provider Last Rate Last Admin   ??? levonorgestrel (MIRENA) 20 mcg/24 hr (5 years) IUD 1 kit  1 each Intrauterine Once Sydnee Levans, MD            Changes to medications: Jennett reports no changes at this time.    Allergies   Allergen Reactions   ??? Oxycodone Shortness Of Breath, Itching and Rash   ??? Mistletoe ([employer] - From [employer])      Allergic to All Types of tree's       Changes to allergies: No    SPECIALTY MEDICATION ADHERENCE     mycophenolate 500mg : ~8-10 days of medicine on hand     Medication Adherence    Patient reported X missed doses in the last month: 3-4  Specialty Medication: mycophenolate 500mg           Specialty medication(s) dose(s) confirmed: Regimen is correct and unchanged.     Are there any concerns with adherence? No    Adherence counseling provided? Not needed    CLINICAL MANAGEMENT AND INTERVENTION      Clinical Benefit Assessment:    Do you feel the medicine is effective or helping your condition? Yes    Clinical Benefit counseling provided? Not needed    Adverse Effects Assessment:    Are you experiencing any side effects? No    Are you experiencing difficulty administering your medicine? No    Quality of Life Assessment:    Rheumatology:   Quality of Life    On a scale of 1 ??? 10 with 1 representing not at all and 10 representing completely ??? how has your rheumatologic condition affected your:  Daily pain level?: decline to answer  Ability to complete your regular daily  tasks (prepare meals, get dressed, etc.)?: decline to answer  Ability to participate in social or family activities?: decline to answer         Have you discussed this with your provider? Not needed    Acute Infection Status:    Acute infections noted within Epic:  No active infections  Patient reported infection: None    Therapy Appropriateness:    Is therapy appropriate? Yes, therapy is appropriate and should be continued    DISEASE/MEDICATION-SPECIFIC INFORMATION      N/A    PATIENT SPECIFIC NEEDS     - Does the patient have any physical, cognitive, or cultural barriers? No    - Is the patient high risk? Yes, patient is taking a REMS drug. Medication is dispensed in compliance with REMS program    - Does the patient require a Care Management Plan? No     - Does the patient require physician intervention or other additional services (i.e. nutrition, smoking cessation, social work)? No      SHIPPING     Specialty Medication(s) to be Shipped:   Inflammatory Disorders: mycophenolate 500mg     Other medication(s) to be shipped: No additional medications requested for fill at this time     Changes to insurance: No    Delivery Scheduled: Yes, Expected medication delivery date: 07/27/2020.     Medication will be delivered via Next Day Courier to the confirmed prescription address in [facility].    The patient will receive a drug information handout for each medication shipped and additional FDA Medication Guides as required.  Verified that patient has previously received a Conservation officer, historic buildings and a Surveyor, mining.    All of the patient's questions and concerns have been addressed.    Karene Fry Anahita Cua   [facility] Specialty Pharmacist

## 2020-07-22 MED ORDER — MYCOPHENOLATE MOFETIL 500 MG TABLET
ORAL_TABLET | Freq: Two times a day (BID) | ORAL | 3 refills | 30.00000 days | Status: CP
Start: 2020-07-22 — End: 2020-08-21
  Filled 2020-07-26: qty 120, 30d supply, fill #0

## 2020-07-22 MED FILL — HYDROXYCHLOROQUINE 200 MG TABLET: ORAL | 90 days supply | Qty: 180 | Fill #1

## 2020-08-01 DIAGNOSIS — D590 Drug-induced autoimmune hemolytic anemia: Principal | ICD-10-CM

## 2020-08-01 DIAGNOSIS — D500 Iron deficiency anemia secondary to blood loss (chronic): Principal | ICD-10-CM

## 2020-08-01 DIAGNOSIS — D6861 Antiphospholipid syndrome: Principal | ICD-10-CM

## 2020-08-01 DIAGNOSIS — D6941 Evans syndrome: Principal | ICD-10-CM

## 2020-08-03 ENCOUNTER — Ambulatory Visit: Admit: 2020-08-03 | Payer: MEDICARE

## 2020-08-03 NOTE — Unmapped (Unsigned)
[facility] visit    Assessment and Plan:    25 y.o. 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     Last seen 03/2020     She is currently prescribed HCQ 400 mg daily and MMF 1000 mg BID but has not been taking x 1 month.  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  She is on warfarin given her h/o APLA.     Overall her lupus sound well controlled without clinical signs of activity today despite being off her HCQ and MMF.  Recommend updating labs and re-starting these medications.  Will order via [facility] given her reported issues with cost.      1. SLE   - Check SLE monitoring labs today: dsDNA, C3, C4, UPA, UA. B  - Restart MMF 1000 mg BID; counseled importance of adherence with this medication for control of her lupus; discussed strategies to improve adherence with AM dose (patient frequently forgets this dose);  She will try setting alarm/using pill box again  - Restart HCQ 400 mg daily; okay to take as a single dose (as opposed to 200 mg BID to improve adherence as patient finds she has more success when she takes both pills at the same time); she is due for HCQ eye exam and has been encouraged to set up follow-up appt  - See below for rituximab 1000 mg IV x 1 q6 months  - continue voltaren gel for joints    2. APLA, Evan Syndrome and h/o iron deficiency anemia  - Follows closely with hematology   - Continue warfarin as prescribed for treatment of her APLA.  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 Chest pain, elevated troponin and hospitalization 11/2019; dx with type II MI;  Initial concern for myopericarditis but with normal TTE and cMRI; PET  8/2021There was a moderate sized, mildly severe, completely reversible perfusion defect involving the apical inferior, mid inferior, basal inferior and basal inferoseptal segments. Finding concerning possible ischemia (adjacent gut activity) but, cannot rule out artifact due to adjacent gut activity per report;  Per cardiology lower suspicion for obstructive CAD so deferred outpatient cath.  Treated w/ colchicine x 1 month, reports improvement in sx; will reach out to cardiology to review case as patients with SLE and APLA have increased risk of premature cardiovascular disease       HealthCare Maintenance:  - Immunizations:                                     Immunization History   Administered Date(s) Administered   ??? COVID-19 VACCINE,MRNA(MODERNA)(PF)(IM) 12/01/2019, 12/29/2019   ??? DTaP 05/29/1996, 07/31/1996, 10/02/1996, 04/14/1997, 06/04/2000   ??? HPV Quadrivalent (Gardasil) 09/23/2008, 11/25/2008, 03/31/2009   ??? Haemophilis Influenza Type B Vaccine Hboc 05/29/1996, 07/31/1996, 10/02/1996, 04/14/1997   ??? Hepatitis A  Vaccine - Unspecified Formulation 06/04/2007   ??? Hepatitis A Vaccine Pediatric / Adolescent 2 Dose IM 12/11/2007   ??? Hepatitis B Vaccine, Unspecified Formulation 11-30-1995, 05/29/1996, 10/02/1996   ??? INFLUENZA TIV (TRI) 18MO+ W/ PRESERV (IM) 03/31/2009, 06/15/2010, 05/25/2011, 03/27/2012   ??? Influenza LAIV (Nasal-Tri) HISTORICAL 02/17/2008   ??? Influenza Vaccine Quad (IIV4 PF) 52mo+ injectable 03/20/2014, 02/01/2017, 01/25/2018, 01/22/2019, 03/18/2020   ??? Influenza Vaccine Quad (IIV4 W/PRESERV) 18MO+ 05/07/2013   ??? Influenza Virus Vaccine, unspecified formulation 05/05/2013   ??? MMR 04/14/1997, 06/04/2000   ??? Meningococcal ACWY, Unspecified Formulation 06/04/2007, 10/03/2012   ??? Meningococcal B Vaccine, OMV Adjuvanted 01/10/2018   ??? Meningococcal C Conjugate 10/03/2012   ??? OPV 05/29/1996, 07/31/1996, 10/02/1996   ??? PNEUMOCOCCAL POLYSACCHARIDE 23 10/03/2012   ??? Pneumococcal, Unspecified Formulation 10/03/2012   ??? Poliovirus,inactivated (IPV) 06/04/2000   ??? SHINGRIX-ZOSTER VACCINE (HZV), RECOMBINANT,SUB-UNIT,ADJUVANTED IM 02/12/2019   ??? TdaP 12/11/2007, 10/10/2013, 01/10/2018   ??? Varicella 09/07/1998, 12/11/2007     I personally spent 35 minutes face-to-face and non-face-to-face in the care of this patient, which includes all pre, intra, and post visit time on the date of service.    ________________________________________________    Reason for visit: Follow up of lupus and Evan's syndrome    HPI: [name] is a 25 y.o. 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  She is a former patient of Dr. Renford Dills who transitioned into Dr. Chaya Jan clinic in 12/2017.    Last seen 03/2020      At present, she has been prescribed HCQ 400 mg daily and MMF 1000 mg BID.  She is receiving q6 month rituximab (1000 mg x 1 q6 months, largely for her Arelia Sneddon    She is on warfarin given her h/o APLA.    Interim History:      Rheumatologic History:  Briefly, she developed Evan's syndrome in 06/2012, and was treated with corticosteroid, IVIG, vincristine, and rituximab. Rituximab (4 weekly doses in July/August 2014) was effective and blood counts remained normal for quite some time. She established with [facility] in 2015 at which time she was [redacted] weeks pregnant (delivered baby without complications). experiencing arthralgias without evidence of frank synovitis.  Her autoimmune work-up revealed + ANA (1:160), equivocal SSA (repeat negative).  She has had very low positive dsDNA and mild hypocomplementemia in the past as well.  She was started on plaquenil for treatment of her SLE in 2015. APS studies were positive.  Developed DVT in 09/2016 and has been on chronic anticoagulation ever since. She was admitted to [facility] hospital in 12/2017 with flare of her SLE and WAIHA.  She was treated with methylprednisolone 1 g x 3 doses (01/27/18 - 01/29/18) with subsequent prednisone taper and, Rituximab 375 mg/m^2 ( 01/27/18, 02/11/18, 02/18/18 and 02/25/18).  Her cell counts  responded well and prednisone tapered.  She was started on AZA in 2020 given concern for ongoing SLE activity and arthralgias, ultimately increased to 200 mg daily in 12/2018.  In 01/2019 and 03/2019, suffered worsening Evan's and was treated with steroids, IVIG and rituximab given refractory ITP.  She was hospitalized again in 07/2019 for her Evan Syndrome - primarily manifested as ITP.  She was treated with dexamethasone, IVIG and rituximab (4/16 and 08/28/2019). After consultation with rheumatology IP team,  AZA was  switched to MMF given her refractory disease.  Per heme, recommend maintenance with ritux 1000 mg x 2 q53months    Treatment Hx:    HCQ 200 mg BID (or 400 mg daily) - started 2015  AZA 100mg  --> 200 mg   Rituximab   - 375 mg/m2 ( 10/28/2012;  11/06/2012; 11/13/2012; 11/20/2012)  - 375 mg/m2 (02/11/2018; 02/18/2018; 02/25/2018)  - 1000 mg  x 2 (02/11/19, 02/27/19; 08/15/19, 08/28/19; 03/01/2020)        ROS: As per HPI, otherwise 10 system review negative    Record review:  I have reviewed the patient's allergies, medications, pertinent past medical, surgical, social and family history and have updated in Epic where appropriate.     Past Medical History:   Diagnosis Date   ??? Acute deep vein thrombosis (DVT) of femoral vein of right lower extremity (CMS-HCC) 10/12/2016   ??? Acute pulmonary embolism (CMS-HCC) 10/12/2016   ??? Anxiety    ??? Clotting disorder (CMS-HCC)    ??? CTS (carpal tunnel syndrome)    ??? Depression complicating pregnancy, antepartum 10/10/2013   ??? Eczema    ??? Evan's syndrome (CMS-HCC)    ??? Evan's syndrome (CMS-HCC)    ??? Fractures     left wrist   ??? Joint pain    ??? Lupus (CMS-HCC)    ??? Migraine    ??? Obesity    ??? Psoriasis    ??? Steatosis of liver 07/01/2014   ??? Thrombocytopenia (CMS-HCC)      Current Outpatient Medications on File Prior to Visit   Medication Sig Dispense Refill   ??? acetaminophen (TYLENOL) 325 MG tablet Take 2 tablets (650 mg total) by mouth every six (6) hours as needed. 30 tablet 0   ??? albuterol HFA 90 mcg/actuation inhaler Inhale 2 puffs every six (6) hours as needed for wheezing or shortness of breath. 8 g 3   ??? clobetasoL (TEMOVATE) 0.05 % cream Apply topically Two (2) times a day. (Patient not taking: Reported on 07/15/2020) 60 g 1   ??? clobetasoL (TEMOVATE) 0.05 % ointment Apply topically Two (2) times a day. (Patient not taking: Reported on 07/15/2020) 60 g 1   ??? famotidine (PEPCID) 20 MG tablet Take 20 mg by mouth Two (2) times a day.     ??? fluorouraciL (EFUDEX) 5 % cream Apply nightly to warts (Patient not taking: Reported on 07/15/2020) 40 g 2   ??? hydrOXYchloroQUINE (PLAQUENIL) 200 mg tablet Take 2 tablets (400mg  total) by mouth daily 180 tablet 3   ??? mycophenolate (CELLCEPT) 500 mg tablet Take 2 tablets (1,000mg  total) by mouth 2 times a day 120 tablet 3   ??? sertraline HCl (SERTRALINE ORAL) Take 150 mg by mouth daily.      ??? warfarin (COUMADIN) 2 MG tablet Take 2 mg by mouth daily with evening meal.     ??? warfarin (COUMADIN) 5 MG tablet Take 5 mg by mouth daily with evening meal.       Current Facility-Administered Medications on File Prior to Visit   Medication Dose Route Frequency Provider Last Rate Last Admin   ??? levonorgestrel (MIRENA) 20 mcg/24 hr (5 years) IUD 1 kit  1 each Intrauterine Once Sydnee Levans, MD         Allergies   Allergen Reactions   ??? Oxycodone Shortness Of Breath, Itching and Rash   ??? Mistletoe ([employer])      Allergic to All Types of tree's  Past Surgical History:   Procedure Laterality Date   ??? CHOLECYSTECTOMY     ??? PERIPHERALLY INSERTED CENTRAL CATHETER INSERTION     ??? PR LAP,CHOLECYSTECTOMY N/A 07/09/2014    Procedure: LAPAROSCOPY, SURGICAL; CHOLECYSTECTOMY;  Surgeon: Bo Mcclintock, MD;  Location: MAIN OR [facility];  Service: Trauma   ??? PR UPPER GI ENDOSCOPY,DIAGNOSIS N/A 07/07/2014    Procedure: UGI ENDO, INCLUDE ESOPHAGUS, STOMACH, & DUODENUM &/OR JEJUNUM; DX W/WO COLLECTION SPECIMN, BY BRUSH OR WASH;  Surgeon: Donneta Romberg, MD;  [facility]: [facility];  Service: Gastroenterology   ??? PR WEDGE BIOPSY OF LIVER Right 07/09/2014    Procedure: BIOPSY OF LIVER;  Surgeon: Bo Mcclintock, MD;  Location: MAIN OR [facility];  Service: Trauma   ??? SKIN BIOPSY     ??? TONGUE SURGERY Midline 2006    2nd grade   ??? WISDOM TOOTH EXTRACTION       Family History   Problem Relation Age of Onset   ??? Diabetes Mother    ??? Hypertension Mother    ??? Cancer Mother    ??? Depression Mother    ??? Diabetes Maternal Grandmother    ??? Cancer Maternal Grandmother    ??? Hearing loss Paternal Grandmother    ??? Arthritis Cousin    ??? Asthma Cousin    ??? No Known Problems Father    ??? No Known Problems Sister    ??? No Known Problems Brother    ??? No Known Problems Maternal Aunt    ??? No Known Problems Maternal Uncle    ??? No Known Problems Paternal Aunt    ??? No Known Problems Paternal Uncle    ??? No Known Problems Maternal Grandfather    ??? No Known Problems Paternal Grandfather    ??? No Known Problems Other    ??? Blindness Neg Hx    ??? Melanoma Neg Hx    ??? Basal cell carcinoma Neg Hx    ??? Squamous cell carcinoma Neg Hx    ??? Anesthesia problems Neg Hx    ??? Broken bones Neg Hx    ??? Clotting disorder Neg Hx    ??? Collagen disease Neg Hx    ??? Dislocations Neg Hx    ??? Fibromyalgia Neg Hx    ??? Gout Neg Hx    ??? Hemophilia Neg Hx    ??? Osteoporosis Neg Hx    ??? Rheumatologic disease Neg Hx    ??? Scoliosis Neg Hx    ??? Severe sprains Neg Hx    ??? Sickle cell anemia Neg Hx    ??? Spinal Compression Fracture Neg Hx    ??? Glaucoma Neg Hx      Social History     Tobacco Use   ??? Smoking status: Never Smoker   ??? Smokeless tobacco: Never Used   Vaping Use   ??? Vaping Use: Never used   Substance Use Topics   ??? Alcohol use: Yes     Comment: Occasionally   2 x  month    ??? Drug use: No       Physical Examination:    There were no vitals filed for this visit.    Gen: NAD, alert and interactive  Eyes: EOMI, Anicteric sclera, non injected conjunctiva  ENT: No oral ulcers, moist oral mucosa, no parotid enlargement  Respiratory: CTAB, normal WOB, symmetric chest expansion  CVS: RRR, no murmurs  Skin: No visible rash appreciated  Neuro: AAO X 3, strength symmetrical bilaterally  MSK:  Shoulders, elbows, wrists without tenderness or swelling, Small joints of hands with out swelling Bilateral knees cool with out effusion and with FROM, ankles w/o swelling and non tender, MTP squeeze non tender  Psych: Mood and affect appropriate and congruent    Reviewed records in EPIC    Labs/Imaging/Other:    Lab Results   Component Value Date    WBC 5.1 05/19/2020    HGB 12.5 05/19/2020    HCT 37.3 05/19/2020    PLT 206 05/19/2020       Lab Results   Component Value Date    NA 139 05/19/2020    K 3.4 05/19/2020    CL 107 05/19/2020    CO2 22.0 05/19/2020    BUN <5 (L) 05/19/2020    CREATININE 0.57 (L) 05/19/2020    GLU 128 05/19/2020    CALCIUM 9.9 05/19/2020    MG 2.0 12/26/2019    PHOS 3.7 01/28/2018       Lab Results   Component Value Date    BILITOT 1.1 05/19/2020    BILIDIR <0.10 08/14/2019    PROT 7.4 05/19/2020    ALBUMIN 4.1 05/19/2020    ALT 50 (H) 05/19/2020    AST 43 (H) 05/19/2020    ALKPHOS 67 05/19/2020    GGT 52 (H) 10/02/2014       Lab Results   Component Value Date    INR 2.46 05/19/2020    APTT 120.3 (H) 05/19/2020       X ray R knee: No acute osseous abnormality, right knee

## 2020-08-22 NOTE — Unmapped (Signed)
New Patient Clinic Note    Referring Physician :  Allie Bossier    History of Present Illness:    Melody Padilla is a 25 y.o. female with history of fractures, psoriasis, acute PE, acute DVT of femoral vein of RLE (on Warfarin), acute DVT of popliteal vein of RLE, OSA, cholelithiasis, steatosis of liveer, GERD, Evan's syndrome, antiphospholipid antibody syndrome, lupus, hemolytic anemia, hyperbilirubinemia, class 2 obesity, positive ANA, and thrombocytopenia who presents for new patient evaluation of pain of toe of left foot. Note: referral incorrect, patient reports issues with right foot.     Today, patient is currently on plaquenil and cellcept for management of her Lupus. She follows with Hematology regarding her history of blood clots and is currently on Warfarin for this. The patient reports that her right foot issues began 6 years ago with itching symptoms. She was referred for right great toe pain along the lateral border of the right great toenail with associated uncontrollable bleeding and partial auto-avulsion of toenail last week. Denies known history of injury or trauma. She follows with Dermatology and reports that she was unaware of her psoriasis diagnosis. She has previously under gone biopsy of hand, knee, and elbow for diagnosis of eczema. She reports that she uses a steroid cream for management of eczema. However, she has not used this cream recently as it manages symptoms for 2-3 weeks before she experiences another flare up. She also reports mild ingrown toenail to left great toe which is not bothersome.     Review of Systems:     Musculoskeletal:  Denies back pain or joint pain   Integument:  Denies rash   Neurologic:  Denies headache, focal weakness or sensory changes     BP 121/70  - Pulse 84  - Temp 36.4 ??C (97.5 ??F) (Temporal)       Allergies:  Allergies   Allergen Reactions   ??? Oxycodone Shortness Of Breath, Itching and Rash   ??? Mistletoe (Viscum Jordan - From Apple Trees) Allergic to All Types of tree's       Medications:   Outpatient Encounter Medications as of 08/23/2020   Medication Sig Dispense Refill   ??? acetaminophen (TYLENOL) 325 MG tablet Take 2 tablets (650 mg total) by mouth every six (6) hours as needed. 30 tablet 0   ??? albuterol HFA 90 mcg/actuation inhaler Inhale 2 puffs every six (6) hours as needed for wheezing or shortness of breath. 8 g 3   ??? famotidine (PEPCID) 20 MG tablet Take 20 mg by mouth Two (2) times a day.     ??? hydrOXYchloroQUINE (PLAQUENIL) 200 mg tablet Take 2 tablets (400mg  total) by mouth daily 180 tablet 3   ??? mycophenolate (CELLCEPT) 500 mg tablet Take 2 tablets (1,000mg  total) by mouth 2 times a day 120 tablet 3   ??? sertraline HCl (SERTRALINE ORAL) Take 150 mg by mouth daily.      ??? warfarin (COUMADIN) 2 MG tablet Take 2 mg by mouth daily with evening meal.      ??? warfarin (COUMADIN) 5 MG tablet Take 5 mg by mouth daily with evening meal.      ??? clobetasoL (TEMOVATE) 0.05 % cream Apply topically Two (2) times a day. (Patient not taking: Reported on 07/15/2020) 60 g 1   ??? clobetasoL (TEMOVATE) 0.05 % ointment Apply topically Two (2) times a day. (Patient not taking: Reported on 07/15/2020) 60 g 1   ??? fluorouraciL (EFUDEX) 5 % cream Apply nightly to  warts (Patient not taking: Reported on 07/15/2020) 40 g 2     Facility-Administered Encounter Medications as of 08/23/2020   Medication Dose Route Frequency Provider Last Rate Last Admin   ??? levonorgestrel (MIRENA) 20 mcg/24 hr (5 years) IUD 1 kit  1 each Intrauterine Once Sydnee Levans, MD           Medical History:  Past Medical History:   Diagnosis Date   ??? Acute deep vein thrombosis (DVT) of femoral vein of right lower extremity (CMS-HCC) 10/12/2016   ??? Acute pulmonary embolism (CMS-HCC) 10/12/2016   ??? Anxiety    ??? Clotting disorder (CMS-HCC)    ??? CTS (carpal tunnel syndrome)    ??? Depression complicating pregnancy, antepartum 10/10/2013   ??? Eczema    ??? Evan's syndrome (CMS-HCC)    ??? Evan's syndrome (CMS-HCC)    ??? Fractures     left wrist   ??? Joint pain    ??? Lupus (CMS-HCC)    ??? Migraine    ??? Obesity    ??? Psoriasis    ??? Steatosis of liver 07/01/2014   ??? Thrombocytopenia (CMS-HCC)        Surgical History:  Past Surgical History:   Procedure Laterality Date   ??? CHOLECYSTECTOMY     ??? PERIPHERALLY INSERTED CENTRAL CATHETER INSERTION     ??? PR LAP,CHOLECYSTECTOMY N/A 07/09/2014    Procedure: LAPAROSCOPY, SURGICAL; CHOLECYSTECTOMY;  Surgeon: Bo Mcclintock, MD;  Location: MAIN OR [facility];  Service: Trauma   ??? PR UPPER GI ENDOSCOPY,DIAGNOSIS N/A 07/07/2014    Procedure: UGI ENDO, INCLUDE ESOPHAGUS, STOMACH, & DUODENUM &/OR JEJUNUM; DX W/WO COLLECTION SPECIMN, BY BRUSH OR WASH;  Surgeon: Donneta Romberg, MD;  [facility]: [facility];  Service: Gastroenterology   ??? PR WEDGE BIOPSY OF LIVER Right 07/09/2014    Procedure: BIOPSY OF LIVER;  Surgeon: Bo Mcclintock, MD;  Location: MAIN OR [facility];  Service: Trauma   ??? SKIN BIOPSY     ??? TONGUE SURGERY Midline 2006    2nd grade   ??? WISDOM TOOTH EXTRACTION         Social History:  Social History     Socioeconomic History   ??? Marital status: Single     Spouse name: None   ??? Number of children: None   ??? Years of education: None   ??? Highest education level: None   Occupational History   ??? 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 Smoking status: Never Smoker   ??? Smokeless tobacco: Never Used   Vaping Use   ??? Vaping Use: Never used   Substance and Sexual Activity   ??? Alcohol use: Yes     Comment: Occasionally   2 x  month    ??? Drug use: No   ??? Sexual activity: Yes     Partners: Male     Birth control/protection: I.U.D.   Other Topics Concern   ??? Do you use sunscreen? Yes   ??? Tanning bed use? No   ??? Are you easily burned? No   ??? Excessive sun exposure? No   ??? Blistering sunburns? No   Social History Narrative    Lives with parents.  Has a son who is 32.58 years old.  In community college studying [employer].     Social Determinants of Health Financial Resource Strain: Low Risk    ??? Difficulty of Paying Living Expenses: Not very hard   Food  Insecurity: No Food Insecurity   ??? Worried About Programme researcher, broadcasting/film/video in the Last Year: Never true   ??? Ran Out of Food in the Last Year: Never true   Transportation Needs: No Transportation Needs   ??? Lack of Transportation (Medical): No   ??? Lack of Transportation (Non-Medical): No   Physical Activity: Not on file   Stress: Not on file   Social Connections: Not on file       Family History:  Family History   Problem Relation Age of Onset   ??? Diabetes Mother    ??? Hypertension Mother    ??? Cancer Mother    ??? Depression Mother    ??? Diabetes Maternal Grandmother    ??? Cancer Maternal Grandmother    ??? Hearing loss Paternal Grandmother    ??? Arthritis Cousin    ??? Asthma Cousin    ??? No Known Problems Father    ??? No Known Problems Sister    ??? No Known Problems Brother    ??? No Known Problems Maternal Aunt    ??? No Known Problems Maternal Uncle    ??? No Known Problems Paternal Aunt    ??? No Known Problems Paternal Uncle    ??? No Known Problems Maternal Grandfather    ??? No Known Problems Paternal Grandfather    ??? No Known Problems Other    ??? Blindness Neg Hx    ??? Melanoma Neg Hx    ??? Basal cell carcinoma Neg Hx    ??? Squamous cell carcinoma Neg Hx    ??? Anesthesia problems Neg Hx    ??? Broken bones Neg Hx    ??? Clotting disorder Neg Hx    ??? Collagen disease Neg Hx    ??? Dislocations Neg Hx    ??? Fibromyalgia Neg Hx    ??? Gout Neg Hx    ??? Hemophilia Neg Hx    ??? Osteoporosis Neg Hx    ??? Rheumatologic disease Neg Hx    ??? Scoliosis Neg Hx    ??? Severe sprains Neg Hx    ??? Sickle cell anemia Neg Hx    ??? Spinal Compression Fracture Neg Hx    ??? Glaucoma Neg Hx            Physical Exam:   Consitutional: No acute distress, alert and oriented.    Vascular:   Palpable pulses of the DP and PT artery bilateral feet  Temperature gradient within normal limits  Capillary refill brisk x 10  No varicosities noted bilaterally    Neurologic:   Intact sensation with 10 gram filament bilateral feet    Orthopedic:   Average arch morphology   No evidence of Charcot   No evidence of leg length discrepancy  Muscle strength 5/5 dorsiflexion/plantarflexion/inversion/eversion bilaterally    Dermatologic:   No callus development   No interdigital fissures or maceration bilateral feet   Oil pits on nails consistent with possible psoriasis clinically  No clinical evidence of deformity consistent with psoriatic arthritis  Fissure of right great toenail with no pus, cellulitis, or odor  Peeling and scaling around margin of right 3rd and 5th toenails    Imaging:   None.     Assessment/Plan:  1. Questionable hx of psoriasis - Reviewed Dermatology notes (03/03/2020) and Rheumatology notes (03/17/2020) in which they did not discuss diagnosis of psoriasis with evidence of oil pits on nail consistent with psoriasis on exam. I reached out to patient's Dermatology and Rheumatology  providers to discuss if patient has been diagnosed with this and management recommendations.    2. Right great toenail, lateral border spontaneous bleeding and partial nail auto-avulsion - We discussed the option of partial right great toenail removal with chemical cautery if the patient is interested. However, given no signs of infection, we discussed the option of further workup of possible psoriasis contributing to these symptoms. Patient reports she has previously undergone nail surgery several years ago in [place] prior to starting anti-coagulation therapy. We discussed that if she interested in proceeding with repeat nail surgery, that she will require alternative peri-procedure anti-coagulation and clearance from her Rheumatology and Hematology providers. I have contacted Dr. Ilsa Iha and Dr. Marvis Moeller in Rheumatology and Dermatology respectively to discuss if patient has clinical psoriasis and if this is being under-managed contributing to nail symptoms. Recommended application of steroid cream to affected toes as her symptoms may be consistent with previously diagnosed eczema. Also recommended Dermatology follow up with patient regarding alternative eczema management as he reports that steroid cream does not fully resolve her symptoms, leading to flare up after 2-3 weeks of use. We discussed strict return precautions to the ED including redness, swelling, purulent drainage, fever, or chills.      3. Lupus, eczema - Managed by Dermatology and Rheumatology. RTC as needed; I will reach out to patient via MyChart regarding discussion and recommendations from Derm and Rheum.     25 minutes spent face-to-face counseling and greater than 50% of the visit was spent face-to-face, 20* minutes spent on additional coordination of care    Scribe's Attestation: London Sheer, DPM obtained and performed the history, physical exam and medical decision making elements that were  entered into the chart. Documentation assistance was provided by me personally, a scribe. Signed by Cinda Quest, Scribe, on August 23, 2020 at 11:10 AM.     ----------------------------------------------------------------------------------------------------------------------  August 23, 2020 1:11 PM. Documentation assistance provided by the Scribe. I was present during the time the encounter was recorded. The information recorded by the Scribe was done at my direction and has been reviewed and validated by me.  ----------------------------------------------------------------------------------------------------------------------

## 2020-08-23 ENCOUNTER — Ambulatory Visit: Admit: 2020-08-23 | Discharge: 2020-08-24 | Payer: MEDICARE | Attending: Podiatrist | Primary: Podiatrist

## 2020-08-23 DIAGNOSIS — L309 Dermatitis, unspecified: Principal | ICD-10-CM

## 2020-08-23 DIAGNOSIS — L603 Nail dystrophy: Principal | ICD-10-CM

## 2020-08-23 DIAGNOSIS — Z872 Personal history of diseases of the skin and subcutaneous tissue: Principal | ICD-10-CM

## 2020-08-23 DIAGNOSIS — M329 Systemic lupus erythematosus, unspecified: Principal | ICD-10-CM

## 2020-08-23 DIAGNOSIS — M79674 Pain in right toe(s): Principal | ICD-10-CM

## 2020-08-24 ENCOUNTER — Ambulatory Visit: Admit: 2020-08-24 | Discharge: 2020-08-25 | Payer: MEDICARE

## 2020-08-24 LAB — CBC W/ AUTO DIFF
BASOPHILS ABSOLUTE COUNT: 0 10*9/L (ref 0.0–0.1)
BASOPHILS RELATIVE PERCENT: 0.5 %
EOSINOPHILS ABSOLUTE COUNT: 0.3 10*9/L (ref 0.0–0.5)
EOSINOPHILS RELATIVE PERCENT: 4.8 %
HEMATOCRIT: 37.2 % (ref 34.0–44.0)
HEMOGLOBIN: 12.9 g/dL (ref 11.3–14.9)
LYMPHOCYTES ABSOLUTE COUNT: 1 10*9/L — ABNORMAL LOW (ref 1.1–3.6)
LYMPHOCYTES RELATIVE PERCENT: 18.9 %
MEAN CORPUSCULAR HEMOGLOBIN CONC: 34.6 g/dL (ref 32.0–36.0)
MEAN CORPUSCULAR HEMOGLOBIN: 26.5 pg (ref 25.9–32.4)
MEAN CORPUSCULAR VOLUME: 76.7 fL — ABNORMAL LOW (ref 77.6–95.7)
MEAN PLATELET VOLUME: 9.9 fL (ref 6.8–10.7)
MONOCYTES ABSOLUTE COUNT: 0.5 10*9/L (ref 0.3–0.8)
MONOCYTES RELATIVE PERCENT: 8.9 %
NEUTROPHILS ABSOLUTE COUNT: 3.5 10*9/L (ref 1.8–7.8)
NEUTROPHILS RELATIVE PERCENT: 66.9 %
NUCLEATED RED BLOOD CELLS: 0 /100{WBCs} (ref <=4)
PLATELET COUNT: 206 10*9/L (ref 150–450)
RED BLOOD CELL COUNT: 4.86 10*12/L (ref 3.95–5.13)
RED CELL DISTRIBUTION WIDTH: 16.2 % — ABNORMAL HIGH (ref 12.2–15.2)
WBC ADJUSTED: 5.2 10*9/L (ref 3.6–11.2)

## 2020-08-24 LAB — FERRITIN: FERRITIN: 4.6 ng/mL — ABNORMAL LOW

## 2020-08-24 MED ADMIN — sodium ferric gluconate (FERRLECIT) 250 mg in sodium chloride (NS) 0.9 % 100 mL IVPB: 250 mg | INTRAVENOUS | @ 16:00:00 | Stop: 2020-08-24

## 2020-08-24 NOTE — Unmapped (Signed)
Pt presents for first Ferrlicet infusion. Pt given written information about medication- reviewed same.  VSS, IV placed.  Pt aware of potential reaction/side effects, call bell within reach.  1136 Ferrlicet 250mg  started  1335 Infusion complete. Pt remained for 30 min post infusion.  Pt tolerated without complication, VSS.  IV flushed per policy and d/c'd, gauze and coban applied.  Pt left clinic in no acute distress.

## 2020-08-26 NOTE — Unmapped (Signed)
[facility] Refill Coordination Note    Specialty Medication(s) to be Shipped:   Inflammatory Disorders: mycophenolate    Other medication(s) to be shipped: No additional medications requested for fill at this time     Melody Padilla, DOB: 02/27/1996  Phone: [phone] (home)       All above HIPAA information was verified with patient.     Was a Nurse, learning disability used for this call? No    Completed refill call assessment today to schedule patient's medication shipment from the [facility] [phone]).  All relevant notes have been reviewed.     Specialty medication(s) and dose(s) confirmed: Regimen is correct and unchanged.   Changes to medications: Melody Padilla reports no changes at this time.  Changes to insurance: No  New side effects reported not previously addressed with a pharmacist or physician: None reported  Questions for the pharmacist: No    Confirmed patient received a Conservation officer, historic buildings and a Surveyor, mining with first shipment. The patient will receive a drug information handout for each medication shipped and additional FDA Medication Guides as required.       DISEASE/MEDICATION-SPECIFIC INFORMATION        N/A    SPECIALTY MEDICATION ADHERENCE     Medication Adherence    Patient reported X missed doses in the last month: 2  Specialty Medication: mycophenolate  Patient is on additional specialty medications: No  Patient is on more than two specialty medications: No  Any gaps in refill history greater than 2 weeks in the last 3 months: no  Demonstrates understanding of importance of adherence: yes  Informant: patient              Were doses missed due to medication being on hold? No    Mycophenolate 500mg : Patient has 7 days of medication on hand    REFERRAL TO PHARMACIST     Referral to the pharmacist: Not needed      [facility]     Shipping address confirmed in Epic.     Delivery Scheduled: Yes, Expected medication delivery date: 5/3.     Medication will be delivered via Next Day Courier to the prescription address in Epic WAM.    Melody Padilla   [name] Specialty Technician

## 2020-08-27 DIAGNOSIS — D590 Drug-induced autoimmune hemolytic anemia: Principal | ICD-10-CM

## 2020-08-27 DIAGNOSIS — D6941 Evans syndrome: Principal | ICD-10-CM

## 2020-08-27 DIAGNOSIS — D6861 Antiphospholipid syndrome: Principal | ICD-10-CM

## 2020-08-30 ENCOUNTER — Ambulatory Visit: Admit: 2020-08-30 | Discharge: 2020-08-31 | Payer: MEDICARE

## 2020-08-30 DIAGNOSIS — D590 Drug-induced autoimmune hemolytic anemia: Principal | ICD-10-CM

## 2020-08-30 DIAGNOSIS — D6861 Antiphospholipid syndrome: Principal | ICD-10-CM

## 2020-08-30 DIAGNOSIS — D6941 Evans syndrome: Principal | ICD-10-CM

## 2020-08-30 DIAGNOSIS — D500 Iron deficiency anemia secondary to blood loss (chronic): Principal | ICD-10-CM

## 2020-08-30 LAB — CBC W/ AUTO DIFF
BASOPHILS ABSOLUTE COUNT: 0.1 10*9/L (ref 0.0–0.1)
BASOPHILS RELATIVE PERCENT: 1.1 %
EOSINOPHILS ABSOLUTE COUNT: 0.3 10*9/L (ref 0.0–0.5)
EOSINOPHILS RELATIVE PERCENT: 6.2 %
HEMATOCRIT: 38.5 % (ref 34.0–44.0)
HEMOGLOBIN: 13.4 g/dL (ref 11.3–14.9)
LYMPHOCYTES ABSOLUTE COUNT: 0.9 10*9/L — ABNORMAL LOW (ref 1.1–3.6)
LYMPHOCYTES RELATIVE PERCENT: 19.9 %
MEAN CORPUSCULAR HEMOGLOBIN CONC: 34.8 g/dL (ref 32.0–36.0)
MEAN CORPUSCULAR HEMOGLOBIN: 27.1 pg (ref 25.9–32.4)
MEAN CORPUSCULAR VOLUME: 77.9 fL (ref 77.6–95.7)
MEAN PLATELET VOLUME: 10.2 fL (ref 6.8–10.7)
MONOCYTES ABSOLUTE COUNT: 0.5 10*9/L (ref 0.3–0.8)
MONOCYTES RELATIVE PERCENT: 9.8 %
NEUTROPHILS ABSOLUTE COUNT: 3 10*9/L (ref 1.8–7.8)
NEUTROPHILS RELATIVE PERCENT: 63 %
PLATELET COUNT: 207 10*9/L (ref 150–450)
RED BLOOD CELL COUNT: 4.94 10*12/L (ref 3.95–5.13)
RED CELL DISTRIBUTION WIDTH: 16.6 % — ABNORMAL HIGH (ref 12.2–15.2)
WBC ADJUSTED: 4.7 10*9/L (ref 3.6–11.2)

## 2020-08-30 MED ADMIN — diphenhydrAMINE (BENADRYL) injection 50 mg: 50 mg | INTRAVENOUS | @ 13:00:00 | Stop: 2020-08-30

## 2020-08-30 MED ADMIN — riTUXimab-abbs (TRUXIMA) 1,000 mg in sodium chloride (NS) 0.9 % 500 mL IVPB: 1000 mg | INTRAVENOUS | @ 13:00:00 | Stop: 2020-08-30

## 2020-08-30 MED ADMIN — sodium chloride 0.9% (NS) bolus 1,000 mL: 1000 mL | INTRAVENOUS | @ 14:00:00 | Stop: 2020-08-30

## 2020-08-30 MED ADMIN — acetaminophen (TYLENOL) tablet 650 mg: 650 mg | ORAL | @ 13:00:00 | Stop: 2020-08-30

## 2020-08-30 MED ADMIN — methylPREDNISolone sodium succinate (PF) (Solu-MEDROL) injection 125 mg: 125 mg | INTRAVENOUS | @ 13:00:00 | Stop: 2020-08-30

## 2020-08-30 MED ADMIN — diphenhydrAMINE (BENADRYL) injection 25 mg: 25 mg | INTRAVENOUS | @ 14:00:00 | Stop: 2020-08-30

## 2020-08-30 MED FILL — MYCOPHENOLATE MOFETIL 500 MG TABLET: ORAL | 30 days supply | Qty: 120 | Fill #1

## 2020-08-30 NOTE — Unmapped (Signed)
Pt presents for Rituxan infusion.  Pt denies any recent infection, VSS.  IV placed, labs drawn, premeds administered.  Pt aware of potential reaction/side effects, call bell within reach.    WBC 4.7  ANC 3.0  Platelets 207    0924 Rituxan 1000mg  started at the following rates:  50mg /hr for 30 min  100mg /hr for 30 min  150mg /hr for 30 min  1055 c/o stuffy nose & hurts to swallow - stop infusion-118/ 67 67- pulse ox 99 on RA - stop infusion IVF- ben 25  1006-106 71-61 1102 feels less swollen  1107 throat hurts less 101 68-74- 990 on RA  1114 my throat is fine-[id number] symptoms gone restart at 100 mg/hr x 30 min 100/69-75   then 150 mg/hr x 30 min-97/56-76-sleeping  200 mg/hr x 30 min- 94/56-75- sleeping  250mg /hr for 30 min-91/62-85  300mg /hr for 30 min-100 55-77  350mg /hr for 30 min-103 62-84  400mg /hr until complete 105/76-84    1453 Infusion complete. Pt tolerated without complication, VSS.  IV flushed per policy. IV d/c'd, gauze and coban applied.  Pt left clinic in no acute distress.

## 2020-09-13 ENCOUNTER — Ambulatory Visit: Admit: 2020-09-13 | Discharge: 2020-09-14 | Payer: MEDICARE

## 2020-09-13 DIAGNOSIS — D6941 Evans syndrome: Principal | ICD-10-CM

## 2020-09-13 DIAGNOSIS — D6861 Antiphospholipid syndrome: Principal | ICD-10-CM

## 2020-09-13 DIAGNOSIS — D590 Drug-induced autoimmune hemolytic anemia: Principal | ICD-10-CM

## 2020-09-13 LAB — CBC W/ AUTO DIFF
BASOPHILS ABSOLUTE COUNT: 0 10*9/L (ref 0.0–0.1)
BASOPHILS RELATIVE PERCENT: 0.9 %
EOSINOPHILS ABSOLUTE COUNT: 0.1 10*9/L (ref 0.0–0.5)
EOSINOPHILS RELATIVE PERCENT: 2.3 %
HEMATOCRIT: 38.6 % (ref 34.0–44.0)
HEMOGLOBIN: 13.3 g/dL (ref 11.3–14.9)
LYMPHOCYTES ABSOLUTE COUNT: 0.7 10*9/L — ABNORMAL LOW (ref 1.1–3.6)
LYMPHOCYTES RELATIVE PERCENT: 14.3 %
MEAN CORPUSCULAR HEMOGLOBIN CONC: 34.6 g/dL (ref 32.0–36.0)
MEAN CORPUSCULAR HEMOGLOBIN: 27.5 pg (ref 25.9–32.4)
MEAN CORPUSCULAR VOLUME: 79.6 fL (ref 77.6–95.7)
MEAN PLATELET VOLUME: 9.4 fL (ref 6.8–10.7)
MONOCYTES ABSOLUTE COUNT: 0.4 10*9/L (ref 0.3–0.8)
MONOCYTES RELATIVE PERCENT: 8.6 %
NEUTROPHILS ABSOLUTE COUNT: 3.5 10*9/L (ref 1.8–7.8)
NEUTROPHILS RELATIVE PERCENT: 73.9 %
PLATELET COUNT: 201 10*9/L (ref 150–450)
RED BLOOD CELL COUNT: 4.84 10*12/L (ref 3.95–5.13)
RED CELL DISTRIBUTION WIDTH: 17.1 % — ABNORMAL HIGH (ref 12.2–15.2)
WBC ADJUSTED: 4.7 10*9/L (ref 3.6–11.2)

## 2020-09-14 ENCOUNTER — Ambulatory Visit: Admit: 2020-09-14 | Discharge: 2020-09-15 | Payer: MEDICARE

## 2020-09-14 MED ADMIN — sodium ferric gluconate (FERRLECIT) 250 mg in sodium chloride (NS) 0.9 % 100 mL IVPB: 250 mg | INTRAVENOUS | @ 15:00:00 | Stop: 2020-09-14

## 2020-09-14 NOTE — Unmapped (Signed)
1045 Patient in today for Ferrlicit infusion. Patient has no s/s of infection. No issues from the last infusion.  1124 Ferrlicit started ,patient instructed to use call bell /call nurse for any s/s of unsual symptoms during infusion. Patient educated on possible s/s of reaction,such as chestpain ,itching,shortness of breath lightheadedness and any kind of discomfort. Patient verbalized understanding.  1325 Infusion completed and well tolerated.  1345 Ferrlicit 250 mg/100 ml NS infused over 2hrs per protocol . Pt alert and oriented, offered no complaints during infusion. IV flushed with 10ml NS post infusion. Pt tolerated infusion well.

## 2020-09-22 NOTE — Unmapped (Signed)
patient has enough medication on hand, rescheduling refill call for 6/8

## 2020-09-28 ENCOUNTER — Ambulatory Visit: Admit: 2020-09-28 | Payer: MEDICARE

## 2020-10-03 DIAGNOSIS — D500 Iron deficiency anemia secondary to blood loss (chronic): Principal | ICD-10-CM

## 2020-10-05 ENCOUNTER — Ambulatory Visit: Admit: 2020-10-05 | Discharge: 2020-10-06 | Payer: MEDICARE

## 2020-10-05 MED ADMIN — sodium chloride 0.9% (NS) bolus 1,000 mL: 1000 mL | INTRAVENOUS | @ 18:00:00 | Stop: 2020-10-05

## 2020-10-05 MED ADMIN — diphenhydrAMINE (BENADRYL) injection 25 mg: 25 mg | INTRAVENOUS | @ 18:00:00 | Stop: 2020-10-05

## 2020-10-05 MED ADMIN — sodium ferric gluconate (FERRLECIT) 250 mg in sodium chloride (NS) 0.9 % 100 mL IVPB: 250 mg | INTRAVENOUS | @ 16:00:00 | Stop: 2020-10-05

## 2020-10-05 NOTE — Unmapped (Signed)
Pt presents for Ferrlicet infusion.  VSS, IV placed in left forearm.  Pt aware of potential reaction/side effects, call bell within reach.  1145 Ferrlicet 250 mg started  1340 Infusion complete.  Pt tolerated without complication, VSS.  IV flushed per policy and patient to be monitored for 30 minutes.     1355 Pt c/o itching in back of throat. Benadryl 25 IV given. Fluids started.     1416 Pt states symptoms have resolved  1435d/c'd, gauze and coban applied. Pt educated on delayed reaction symptoms.  Pt left clinic in no acute distress.

## 2020-10-08 NOTE — Unmapped (Signed)
[facility] Refill Coordination Note    Specialty Medication(s) to be Shipped:   Inflammatory Disorders: mycophenolate    Other medication(s) to be shipped: No additional medications requested for fill at this time     Melody Padilla, DOB: May 15, 1995  Phone: [phone] (home)       All above HIPAA information was verified with patient.     Was a Nurse, learning disability used for this call? No    Completed refill call assessment today to schedule patient's medication shipment from the [facility] [phone]).  All relevant notes have been reviewed.     Specialty medication(s) and dose(s) confirmed: Regimen is correct and unchanged.   Changes to medications: [name] reports no changes at this time.  Changes to insurance: No  New side effects reported not previously addressed with a pharmacist or physician: None reported  Questions for the pharmacist: No    Confirmed patient received a Conservation officer, historic buildings and a Surveyor, mining with first shipment. The patient will receive a drug information handout for each medication shipped and additional FDA Medication Guides as required.       DISEASE/MEDICATION-SPECIFIC INFORMATION        N/A    SPECIALTY MEDICATION ADHERENCE     Medication Adherence    Patient reported X missed doses in the last month: 0  Specialty Medication: mycophenolate  Patient is on additional specialty medications: No  Patient is on more than two specialty medications: No  Any gaps in refill history greater than 2 weeks in the last 3 months: no  Demonstrates understanding of importance of adherence: yes  Informant: patient              Were doses missed due to medication being on hold? No    Mycophenolate 500mg : Patient has 7 days of medication on hand    REFERRAL TO PHARMACIST     Referral to the pharmacist: Not needed      [facility]     Shipping address confirmed in Epic.     Delivery Scheduled: Yes, Expected medication delivery date: 6/16.     Medication will be delivered via Next Day Courier to the prescription address in Epic WAM.    [name]   [facility] Specialty Technician

## 2020-10-13 MED FILL — MYCOPHENOLATE MOFETIL 500 MG TABLET: ORAL | 30 days supply | Qty: 120 | Fill #2

## 2020-10-14 ENCOUNTER — Ambulatory Visit: Admit: 2020-10-14 | Discharge: 2020-10-15 | Payer: MEDICARE | Attending: Internal Medicine | Primary: Internal Medicine

## 2020-10-14 DIAGNOSIS — Z86718 Personal history of other venous thrombosis and embolism: Principal | ICD-10-CM

## 2020-10-14 DIAGNOSIS — D6861 Antiphospholipid syndrome: Principal | ICD-10-CM

## 2020-10-14 DIAGNOSIS — D6941 Evans syndrome: Principal | ICD-10-CM

## 2020-10-14 DIAGNOSIS — D500 Iron deficiency anemia secondary to blood loss (chronic): Principal | ICD-10-CM

## 2020-10-14 DIAGNOSIS — D696 Thrombocytopenia, unspecified: Principal | ICD-10-CM

## 2020-10-14 NOTE — Unmapped (Signed)
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     1. Iron deficiency anemia:  Likely due to heavy menstrual bleeding and was recently hospitalized for the same.   S/p 3 doses of ferrlicit (250 mg per dose). Reacted to the most recent dose.   Microcytosis resolved.   Will hold-off on further doses and monitor.     2. Evan's syndrome:  Treatment history  Prior flare in 2014 and was treated with steroids, IVIG, Rituximab, and ultimately vincristine for control.    Has experienced multiple flares over the last 2 years.    (i) She was hospitalized 12/2017 with WAIHA flare.  Her platelets on admission were wnl. She received methylprednisolone 1 g x 3 doses (01/27/18 - 01/29/18), Rituximab 375 mg/m^2 (x4 01/27/18-02/25/18). She had a prolonged steroid taper and was started on Azathioprine as a steroid sparing agent by Rheumatology.   (ii) Evan's flares in October and November 2020 treated with 4 days of IVIG, 4 days of high-dose dexamethasone each time, and 1 g rituximab x2 doses (completed 02/27/2019).  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     Doing well from the Evan's syndrome standpoint.       3. 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   Continue to follow with PCP for this.   On Rituxan q6 months for maintenance. Last dose in May 2022, next will be in Nov 2022.     4. SLE  Primary manifestations include joint pains.    She follows with [facility] (Dr. Ilsa Iha).  She is currently on Plaquenil and Cellcept.       Will schedule follow up visit with hematology in 3 months.       -------------------------------    History of Present Illness:  Melody Padilla is a 25 y.o. Hispanic woman with SLE, triple positive APLAS (on warfarin) and Evans syndrome (WAIHA + ITP) who is being seen at the hematology clinic today for follow-up.  I last saw Melody Padilla in clinic in March 2022. Kindly refer to my clinic note for full details.     Background history:  Melody Padilla was hospitalized at [facility] from June 13-21, 2018.  She was admitted for evaluation of right lower extremity and groin pain and found to have ileofemoral DVT and pulmonary embolism.  She was initiated on anticoagulation and has remained on this following confirmation of the diagnosis of antiphospholipid syndrome.  ??  Melody Padilla was subsequently hospitalized from 01/25/18 - 01/29/18 with WAIHA flare.  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  Her platelets were appropriate at that time.  She received IV methylpred for 3 days from 9/29 - 10/1.  She also started on Rituximab 9/29 with plan for weekly infusion.  Her Hgb on day of discharge was 9.2.  Bilirubin trended down to 3.1, and LDH to 1002 on day of discharge.   ??  At the time of her follow-up visit with me in June 2020, Melody Padilla reported being well since discharge from the hospital.  She was started on Azathiprine by  Dr.Snyder. She was tolerating this well. She was evaluated at a local ED in April 2020 for left sided non-pleuritic chest pain and reportedly had a negative cardiac and PE work up (we do not have the results).  3 weeks earlier she noted increasing fatigue, lightheadedness and occasional bruising that was more excessive than what she has had with her INR being therapeutic. She was not using NSAIDs and no over the counter medications and apart from a recent 4 day period where she ran out of her medications, she was very compliant.  I recommended continuing the current treatment plan at that time.     Melody Padilla was found to have a flare of Evan's syndrome in the later part of September 2020. Platelets progressively decreased and were 30,000 by February 03, 2019.  I advised her to hold anticoagulation and started her on prednisone 40 mg daily but her thrombocytopenia continue to progress and she was hospitalized at [facility] from 10/12-10/24/2020 for management.  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  She was treated with a 4-day course of IVIG, 4 days of high-dose dexamethasone, and dose number 1 out of 2 of 1 g rituximab (02/11/2019).  Platelets 117,000 on discharge.  Anticoagulation was resumed with warfarin on Lovenox bridge.  ??  At the time of her visit with me in early November 2020, Melody Padilla was continuing to bridge with Lovenox while awaiting therapeutic INR on warfarin.  Given extensive bruising over her abdominal wall from the Lovenox injections, I have recommended that she stop the Lovenox and just take the warfarin.  She reported continue to feel fatigued and difficulty with insomnia.  She was not discharged on any steroids.  She had the second dose of rituximab at the hematology clinic on 02/27/2019.  CBC at that time showed normal hemoglobin, platelets of 96,000.  INR was 2.29.  I recommended continuing weekly CBCs, which she typically had drawn at the [facility] clinic.  ??  Unfortunately, Melody Padilla was again hospitalized from November 21 to March 25, 2019 for flare of ITP.  She developed epistaxis x3 and some gingival bleeding and labs revealed a platelet count of 9.  She reported to [facility] and was hospitalized.  She was treated with steroids and IVIG. Home azathioprine and plaquenil were continued throughout admission per rheumatology.  She was discharged on a 4-week taper of prednisone starting at 40 mg once daily.  Platelet count on day of discharge is 103,000.  She was discharged on Metformin with instructions to take it as long as she was taking prednisone.  This was due to elevated blood sugars observed during the hospitalization while on steroids.  Cherrelle reports doing well since discharge from the bleeding standpoint.  This week is the last of her prednisone taper.  She is understandably concerned about whether she would relapse now that she is coming off steroids.  She has gained about 10 pounds over the last 2 months from repeated courses of steroids.  She remains on warfarin.  She has not had any labs in almost 2 weeks.  Last platelet count was greater than 200,000 and INR was therapeutic.  She continues to have some shortness of breath with ADLs and minimal exertion.  This has been ongoing for a number of months now.      Ms. Aaryn Parrilla was sick with COVID-19 in June.     She was hospitalized  from 7/6-11/06/2019 with heavy menstrual bleeding. Hgb was at baseline 12.2 with normal platelets. INR was 6.95 and she was administered vitamin K and discharged.    She saw ID on 11/12/2019 & dermatology on 11/19/19 regarding skin lesions which were assessed to be likely cutaneous lupus vs eczematous dermatitis.     End of July 2021 she hit her head on the car frame as she was entering and had a severe headache. She went to the ED but left before evaluation as the wait was taking too long. Her headache has since improved substantially and she denies any vision change, tinnitus, nausea/vomiting, or weakness.     [place]ent [place] [employer] trip in August [place]. Uneventful trip.     Developed persistent joint pains and chest pain in August 2021 - admitted to [facility].  Underwent extensive evaluation -- thought to be related to lupus and possibility of type 2 MI raised as well. She did not undergo cardiac cath as the likelihood of finding significant coronary disease is low given age. INR was subtherapeutic upon admission and she was bridged with lovenox.     Scheduled for iron infusion following last visit with me. Received it on 02/04/20 but aborted as she developed chest pain 10 mins into the test dose. Required epi-pen.     At the time of her visit in Dec 2021, Ms. Jaylan Duggar reported feeling very tired. Gets dizzy and suffers HAs multiple days a week.  She was in the ER the day before visit for bleeding from the toes - INR at 6.45. She reported continued episodes of CPs 1-2x/day. Last 10 secs or so, spontaneously resolve. Has had work-up for this in the past, all negative. Gained ~ 10 lbs over the last 3 months.  Attributed this to decreased activity (fatigue) and diet.     Ms. Kathleene Bergemann was in the ER for sore throat and shortness of breath in Jan 2022.  Work-up included CTA - no new PEs. Was found to have COVID. Lasted ~ 10 days. The SOB persisted for weeks after.     At the time of her visit with me in March 2022, Jullisa reported that she has CPAP but was not using. Doing well from the anticoagulation standpoint.  INR last week was 2.5. Had started going to the gym - skipped this week given fatigue and mood was also down, feels may be her depression. Remains on plaquenil and cellcept.  Was started famotidine and resumed sertraline this month - had stopped it in Nov 2021.   I ordered her next Rituxan maintenance dose for May 2022 and also ordered Ferrlicit 250 mg IV x 4 doses to correct her iron deficiency.     Interval history and Review of Systems:  Received 3 doses of ferrlicit.    Got Rituxan in May 2022 as planned - next dose in November.   Developed a runny nose and itchy throat with rituxan and ferrlicit dose #3 - resolved with benadryl. .   Continuing to feel fatigues. Struggling with depression - split with BF in May.   Not using CPAP.  Yet to get covid booster.   Planning trip to [place] in October 2022.   Exercising - in the gym for the last month. Working on improving her diet.   A 12 systems ROS was otherwise negative.       Past Medical History:   Diagnosis Date   ??? Acute deep vein thrombosis (DVT) of femoral vein of right lower extremity (  CMS-HCC) 10/12/2016   ??? Acute pulmonary embolism (CMS-HCC) 10/12/2016 ??? Anxiety    ??? Clotting disorder (CMS-HCC)    ??? CTS (carpal tunnel syndrome)    ??? Depression complicating pregnancy, antepartum 10/10/2013   ??? Eczema    ??? Evan's syndrome (CMS-HCC)    ??? Evan's syndrome (CMS-HCC)    ??? Fractures     left wrist   ??? Joint pain    ??? Lupus (CMS-HCC)    ??? Migraine    ??? Obesity    ??? Psoriasis    ??? Steatosis of liver 07/01/2014   ??? Thrombocytopenia (CMS-HCC)        Family History   Problem Relation Age of Onset   ??? Diabetes Mother    ??? Hypertension Mother    ??? Cancer Mother    ??? Depression Mother    ??? Diabetes Maternal Grandmother    ??? Cancer Maternal Grandmother    ??? Hearing loss Paternal Grandmother    ??? Arthritis Cousin    ??? Asthma Cousin    ??? No Known Problems Father    ??? No Known Problems Sister    ??? No Known Problems Brother    ??? No Known Problems Maternal Aunt    ??? No Known Problems Maternal Uncle    ??? No Known Problems Paternal Aunt    ??? No Known Problems Paternal Uncle    ??? No Known Problems Maternal Grandfather    ??? No Known Problems Paternal Grandfather    ??? No Known Problems Other    ??? Blindness Neg Hx    ??? Melanoma Neg Hx    ??? Basal cell carcinoma Neg Hx    ??? Squamous cell carcinoma Neg Hx    ??? Anesthesia problems Neg Hx    ??? Broken bones Neg Hx    ??? Clotting disorder Neg Hx    ??? Collagen disease Neg Hx    ??? Dislocations Neg Hx    ??? Fibromyalgia Neg Hx    ??? Gout Neg Hx    ??? Hemophilia Neg Hx    ??? Osteoporosis Neg Hx    ??? Rheumatologic disease Neg Hx    ??? Scoliosis Neg Hx    ??? Severe sprains Neg Hx    ??? Sickle cell anemia Neg Hx    ??? Spinal Compression Fracture Neg Hx    ??? Glaucoma Neg Hx      Social History  Socioeconomic History   ??? Marital status: Single     Spouse name: Not on file   ??? Number of children: Not on file   ??? Years of education: Not on file   ??? Highest education level: Not on file   Occupational History   ??? 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 Smoking status: Never Smoker   ??? Smokeless tobacco: Never Used   Vaping Use   ??? Vaping Use: Never used   Substance and Sexual Activity   ??? Alcohol use: Yes     Comment: Occasionally   2 x  month    ??? Drug use: No   ??? Sexual activity: Yes     Partners: Male     Birth control/protection: I.U.D.   Other Topics Concern   ??? Do you use sunscreen? Yes   ??? Tanning bed use? No   ??? Are you easily burned? No   ??? Excessive sun exposure? No   ??? Blistering sunburns? No  Social History Narrative    Lives with parents.  Has a son who is 69 years old.      Allergies:  Allergies   Allergen Reactions   ??? Oxycodone Shortness Of Breath, Itching and Rash   ??? Mistletoe ([employer])      Allergic to All Types of tree's     Medications:  Ms. Jimya Ciani has a current medication list which includes the following prescription(s): acetaminophen, albuterol, famotidine, hydroxychloroquine, mycophenolate, sertraline hcl, coumadin, and warfarin, and the following Facility-Administered Medications: levonorgestrel.     Physical Exam:  VITALS: BP 98/68  - Pulse 50  - Temp 36.6 ??C (97.8 ??F) (Temporal)  - Wt (!) 113.1 kg (249 lb 6.4 oz)  - SpO2 96%  - BMI 40.27 kg/m??   GEN: Well appearing, no acute distress  HEENT: Pupils equally round, sclerae anicteric, conjunctiva clear, moist mucous membranes, no oral lesions or exudates  NECK: Supple, no JVD  HEME/LYMPH: No palpable cervical or supraclavicular lymphadenopathy  PULMONARY: Clear to ausculation bilaterally. Good air entry in all fileds.   CARDIOVASCULAR: Regular rate and rhythm  EXT:  No bony pain or tenderness. No lower extremity edema  NEURO: Alert and oriented, speech intact, following commands, moving all extremities well, no focal deficits appreciated    Test Results:     05/20/2020: CTA chest:  Evaluation of the distal pulmonary arteries is limited due to poor contrast opacification. Within these limits there is no evidence of large acute central pulmonary embolism.  Questionable residual chronic embolus in the left lower lobe segmental/subsegmental arteries, although evaluation is limited by contrast timing.  Faint focus of groundglass opacity in the right upper lobe which may represent atelectasis, inflammation, or infection. Attention on follow-up.      Results for orders placed or performed in visit on 09/13/20   CBC w/ Differential   Result Value Ref Range    WBC 4.7 3.6 - 11.2 10*9/L    RBC 4.84 3.95 - 5.13 10*12/L    HGB 13.3 11.3 - 14.9 g/dL    HCT 16.1 09.6 - 04.5 %    MCV 79.6 77.6 - 95.7 fL    MCH 27.5 25.9 - 32.4 pg    MCHC 34.6 32.0 - 36.0 g/dL    RDW 40.9 (H) 81.1 - 15.2 %    MPV 9.4 6.8 - 10.7 fL    Platelet 201 150 - 450 10*9/L    Neutrophils % 73.9 %    Lymphocytes % 14.3 %    Monocytes % 8.6 %    Eosinophils % 2.3 %    Basophils % 0.9 %    Absolute Neutrophils 3.5 1.8 - 7.8 10*9/L    Absolute Lymphocytes 0.7 (L) 1.1 - 3.6 10*9/L    Absolute Monocytes 0.4 0.3 - 0.8 10*9/L    Absolute Eosinophils 0.1 0.0 - 0.5 10*9/L    Absolute Basophils 0.0 0.0 - 0.1 10*9/L    Anisocytosis Slight (A) Not Present

## 2020-10-31 DIAGNOSIS — D500 Iron deficiency anemia secondary to blood loss (chronic): Principal | ICD-10-CM

## 2020-11-08 NOTE — Unmapped (Signed)
The [facility] has made a second and final attempt to reach this patient to refill the following medication:Mycophenolate.      We have been unable to leave messages on the following phone numbers: [phone]; [phone] and have sent a MyChart message.    Dates contacted: 7/8, 7/11  Last scheduled delivery: 6/15    The patient may be at risk of non-compliance with this medication. The patient should call the [facility] at [phone] (option 4) to refill medication.    [name]   [facility] Specialty Technician

## 2020-11-24 ENCOUNTER — Ambulatory Visit: Admit: 2020-11-24 | Payer: MEDICARE

## 2020-11-29 DIAGNOSIS — D500 Iron deficiency anemia secondary to blood loss (chronic): Principal | ICD-10-CM

## 2021-01-02 DIAGNOSIS — D500 Iron deficiency anemia secondary to blood loss (chronic): Principal | ICD-10-CM

## 2021-01-13 NOTE — Unmapped (Incomplete)
[facility] RHEUMATOLOGY CLINIC - PHARMACIST NOTES    [name] is currently filling mycophenolate through the [facility]. The [facility] have made multiple unsuccessful attempts to refill her medication(s) the past few weeks.  Last shipment went out on 10/13/2020 for a 1 month supply.      Attempt to reach patient from clinic for follow up today. Spoke with patient and provided [facility] callback number [phone] for patient to set up next delivery. Patient states reason for missing last couple of months was struggling with an episode of depression.    Karolee Stamps, PharmD Candidate

## 2021-01-20 ENCOUNTER — Ambulatory Visit: Admit: 2021-01-20 | Discharge: 2021-01-21 | Payer: MEDICARE | Attending: Internal Medicine | Primary: Internal Medicine

## 2021-01-20 DIAGNOSIS — D6941 Evans syndrome: Principal | ICD-10-CM

## 2021-01-20 DIAGNOSIS — D500 Iron deficiency anemia secondary to blood loss (chronic): Principal | ICD-10-CM

## 2021-01-20 DIAGNOSIS — D6861 Antiphospholipid syndrome: Principal | ICD-10-CM

## 2021-01-20 LAB — PROTIME-INR
INR: 2.97
PROTIME: 35.1 s — ABNORMAL HIGH (ref 9.8–12.8)

## 2021-01-20 NOTE — Unmapped (Signed)
Hematology Clinic Visit    PCP: [facility]     Reason for visit: Follow-up Melody Padilla's syndrome & APS    Assessment and Recommendations:  Melody Padilla is a 25 y.o. 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     1. Iron deficiency anemia:  Likely due to heavy menstrual bleeding and was recently hospitalized for the same.   S/p 3 doses of ferrlicit (250 mg per dose). Reacted to dose 3 and we decided to defer dose #4.    Microcytosis resolved.   Reports CBC 2 weeks ago was normal. Requested records be faxed to me for review.     2. Melody Padilla's syndrome:  Treatment history  Prior flare in 2014 and was treated with steroids, IVIG, Rituximab, and ultimately vincristine for control.    Has experienced multiple flares over the last 2 years.    (i) She was hospitalized 12/2017 with WAIHA flare.  Her platelets on admission were wnl. She received methylprednisolone 1 g x 3 doses (01/27/18 - 01/29/18), Rituximab 375 mg/m^2 (x4 01/27/18-02/25/18). She had a prolonged steroid taper and was started on Azathioprine as a steroid sparing agent by Rheumatology.   (ii) Melody Padilla's flares in October and November 2020 treated with 4 days of IVIG, 4 days of high-dose dexamethasone each time, and 1 g rituximab x2 doses (completed 02/27/2019).  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     Doing well from the Melody Padilla's syndrome standpoint.     3. 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 Relates this to missed doses.   Continue to follow with PCP for this. INR check in clinic today. She will communicate results to PCP.  On Rituxan q6 months for maintenance. Last dose in May 2022, next will be in Nov 2022 - orders placed today and this will be scheduled.     4. SLE  Primary manifestations include joint pains.    She follows with [facility] (Dr. Ilsa Iha).  She is currently on Plaquenil and Cellcept.       Will schedule follow up visit with hematology in 3 months.       -------------------------------    History of Present Illness:  Melody Padilla is a 25 y.o. Hispanic woman with SLE, triple positive APLAS (on warfarin) and Evans syndrome (WAIHA + ITP) who is being seen at the hematology clinic today for follow-up.  I last saw Melody Padilla in clinic on 10/14/2020. Kindly refer to my clinic note for full details.     Background history:  Melody Padilla was hospitalized at [facility] from June 13-21, 2018.  She was admitted for evaluation of right lower extremity and groin pain and found to have ileofemoral DVT and pulmonary embolism.  She was initiated on anticoagulation and has remained on this following confirmation of the diagnosis of antiphospholipid syndrome.  ??  Melody Padilla was subsequently hospitalized from 01/25/18 - 01/29/18 with WAIHA flare.  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  Her platelets were appropriate at that time.  She received IV methylpred for 3 days from 9/29 - 10/1.  She also started on Rituximab 9/29 with plan for weekly infusion.  Her Hgb on day of discharge was 9.2.  Bilirubin trended down to 3.1, and LDH  to 1002 on day of discharge.   ??  At the time of her follow-up visit with me in June 2020, Melody Padilla reported being well since discharge from the hospital.  She was started on Azathiprine by Dr.Snyder. She was tolerating this well. She was evaluated at a local ED in April 2020 for left sided non-pleuritic chest pain and reportedly had a negative cardiac and PE work up (we do not have the results).  3 weeks earlier she noted increasing fatigue, lightheadedness and occasional bruising that was more excessive than what she has had with her INR being therapeutic. She was not using NSAIDs and no over the counter medications and apart from a recent 4 day period where she ran out of her medications, she was very compliant.  I recommended continuing the current treatment plan at that time.     [name] was found to have a flare of Melody Padilla's syndrome in the later part of September 2020.  Platelets progressively decreased and were 30,000 by February 03, 2019.  I advised her to hold anticoagulation and started her on prednisone 40 mg daily but her thrombocytopenia continue to progress and she was hospitalized at [facility] from 10/12-10/24/2020 for management.  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  She was treated with a 4-day course of IVIG, 4 days of high-dose dexamethasone, and dose number 1 out of 2 of 1 g rituximab (02/11/2019).  Platelets 117,000 on discharge.  Anticoagulation was resumed with warfarin on Lovenox bridge.  ??  At the time of her visit with me in early November 2020, Melody Padilla was continuing to bridge with Lovenox while awaiting therapeutic INR on warfarin.  Given extensive bruising over her abdominal wall from the Lovenox injections, I have recommended that she stop the Lovenox and just take the warfarin.  She reported continue to feel fatigued and difficulty with insomnia.  She was not discharged on any steroids.  She had the second dose of rituximab at the hematology clinic on 02/27/2019.  CBC at that time showed normal hemoglobin, platelets of 96,000.  INR was 2.29.  I recommended continuing weekly CBCs, which she typically had drawn at the [facility] clinic.  ??  Unfortunately, Melody Padilla was again hospitalized from November 21 to March 25, 2019 for flare of ITP.  She developed epistaxis x3 and some gingival bleeding and labs revealed a platelet count of 9.  She reported to [facility] ER and was hospitalized.  She was treated with steroids and IVIG. Home azathioprine and plaquenil were continued throughout admission per rheumatology.  She was discharged on a 4-week taper of prednisone starting at 40 mg once daily.  Platelet count on day of discharge is 103,000.  She was discharged on Metformin with instructions to take it as long as she was taking prednisone.  This was due to elevated blood sugars observed during the hospitalization while on steroids.  Melody Padilla reports doing well since discharge from the bleeding standpoint.  This week is the last of her prednisone taper.  She is understandably concerned about whether she would relapse now that she is coming off steroids.  She has gained about 10 pounds over the last 2 months from repeated courses of steroids.  She remains on warfarin.  She has not had any labs in almost 2 weeks.  Last platelet count was greater than 200,000 and INR was therapeutic.  She continues to have  some shortness of breath with ADLs and minimal exertion.  This has been ongoing for a number of months now.      Melody Padilla was sick with COVID-19 in June.     She was hospitalized from 7/6-11/06/2019 with heavy menstrual bleeding. Hgb was at baseline 12.2 with normal platelets. INR was 6.95 and she was administered vitamin K and discharged.    She saw ID on 11/12/2019 & dermatology on 11/19/19 regarding skin lesions which were assessed to be likely cutaneous lupus vs eczematous dermatitis.     End of July 2021 she hit her head on the car frame as she was entering and had a severe headache. She went to the ED but left before evaluation as the wait was taking too long. Her headache has since improved substantially and she denies any vision change, tinnitus, nausea/vomiting, or weakness.     [place] [employer] trip in [place]. Uneventful trip.     Developed persistent joint pains and chest pain in August 2021 - admitted to [facility].  Underwent extensive evaluation -- thought to be related to lupus and possibility of type 2 MI raised as well. She did not undergo cardiac cath as the likelihood of finding significant coronary disease is low given age. INR was subtherapeutic upon admission and she was bridged with lovenox.     Scheduled for iron infusion following last visit with me. Received it on 02/04/20 but aborted as she developed chest pain 10 mins into the test dose. Required epi-pen.     At the time of her visit in Dec 2021, Melody Padilla reported feeling very tired. Gets dizzy and suffers HAs multiple days a week.  She was in the ER the day before visit for bleeding from the toes - INR at 6.45. She reported continued episodes of CPs 1-2x/day. Last 10 secs or so, spontaneously resolve. Has had work-up for this in the past, all negative. Gained ~ 10 lbs over the last 3 months.  Attributed this to decreased activity (fatigue) and diet.     Melody Padilla was in the ER for sore throat and shortness of breath in Jan 2022.  Work-up included CTA - no new PEs. Was found to have COVID. Lasted ~ 10 days. The SOB persisted for weeks after.     At the time of her visit with me in March 2022, Melody Padilla reported that she has CPAP but was not using. Doing well from the anticoagulation standpoint.  INR last week was 2.5. Had started going to the gym - skipped this week given fatigue and mood was also down, feels may be her depression. Remains on plaquenil and cellcept.  Was started famotidine and resumed sertraline this month - had stopped it in Nov 2021.   I ordered her next Rituxan maintenance dose for May 2022 and also ordered Ferrlicit 250 mg IV x 4 doses to correct her iron deficiency.     Melody Padilla received 3 doses of ferrlicit in April/May 2022.     Got Rituxan in May 2022 as planned - next dose in November 2022.   Developed a runny nose and itchy throat with rituxan and ferrlicit dose #3 - resolved with benadryl.  Therefore dose #4 of ferrlicit was deferred.     At the time of her visit with me in June 2022, Melody Padilla reported continuing to feel fatigued. Struggling with depression - split with BF in May. She was not using CPAP.  She  was yet to get a covid booster.  Planning trip to [place] in October 2022.     Interval history and Review of Systems:  Today, Melody Padilla reports she stopped a number of her meds for ~ 1 month. Reports this was because her depression got really bad.   She is now back on all meds and depression is 'better, sort of'. Still dealing with a lot of life stressors.  Her son was started on ADHD meds last week and these are making him drowsy.   No bleeding issues since last visit.   Last INR was 1.6 2 weeks ago. No dose increase as this was likely secondary to missed doses.   Going to [place] October 4 for 2 weeks. Getting her booster today.   Nov dose of Ritxan is yet to be scheduled.   CBC 2 weeks normal - will get records.   A 12 systems ROS was otherwise negative.       Past Medical History:   Diagnosis Date   ??? Acute deep vein thrombosis (DVT) of femoral vein of right lower extremity (CMS-HCC) 10/12/2016   ??? Acute pulmonary embolism (CMS-HCC) 10/12/2016   ??? Anxiety    ??? Clotting disorder (CMS-HCC)    ??? CTS (carpal tunnel syndrome)    ??? Depression complicating pregnancy, antepartum 10/10/2013   ??? Eczema    ??? Melody Padilla's syndrome (CMS-HCC)    ??? Melody Padilla's syndrome (CMS-HCC)    ??? Fractures     left wrist   ??? Joint pain    ??? Lupus (CMS-HCC)    ??? Migraine    ??? Obesity    ??? Psoriasis    ??? Steatosis of liver 07/01/2014   ??? Thrombocytopenia (CMS-HCC)        Family History   Problem Relation Age of Onset   ??? Diabetes Mother    ??? Hypertension Mother    ??? Cancer Mother    ??? Depression Mother    ??? Diabetes Maternal Grandmother    ??? Cancer Maternal Grandmother    ??? Hearing loss Paternal Grandmother    ??? Arthritis Cousin    ??? Asthma Cousin    ??? No Known Problems Father    ??? No Known Problems Sister    ??? No Known Problems Brother    ??? No Known Problems Maternal Aunt    ??? No Known Problems Maternal Uncle    ??? No Known Problems Paternal Aunt    ??? No Known Problems Paternal Uncle    ??? No Known Problems Maternal Grandfather    ??? No Known Problems Paternal Grandfather    ??? No Known Problems Other ??? Blindness Neg Hx    ??? Melanoma Neg Hx    ??? Basal cell carcinoma Neg Hx    ??? Squamous cell carcinoma Neg Hx    ??? Anesthesia problems Neg Hx    ??? Broken bones Neg Hx    ??? Clotting disorder Neg Hx    ??? Collagen disease Neg Hx    ??? Dislocations Neg Hx    ??? Fibromyalgia Neg Hx    ??? Gout Neg Hx    ??? Hemophilia Neg Hx    ??? Osteoporosis Neg Hx    ??? Rheumatologic disease Neg Hx    ??? Scoliosis Neg Hx    ??? Severe sprains Neg Hx    ??? Sickle cell anemia Neg Hx    ??? Spinal Compression Fracture Neg Hx    ??? Glaucoma Neg Hx  Social History  Socioeconomic History   ??? Marital status: Single     Spouse name: Not on file   ??? Number of children: Not on file   ??? Years of education: Not on file   ??? Highest education level: Not on file   Occupational History   ??? 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 Smoking status: Never Smoker   ??? Smokeless tobacco: Never Used   Vaping Use   ??? Vaping Use: Never used   Substance and Sexual Activity   ??? Alcohol use: Yes     Comment: Occasionally   2 x  month    ??? Drug use: No   ??? Sexual activity: Yes     Partners: Male     Birth control/protection: I.U.D.   Other Topics Concern   ??? Do you use sunscreen? Yes   ??? Tanning bed use? No   ??? Are you easily burned? No   ??? Excessive sun exposure? No   ??? Blistering sunburns? No   Social History Narrative    Lives with parents.  Has a son who is 33 years old.      Allergies:  Allergies   Allergen Reactions   ??? Oxycodone Shortness Of Breath, Itching and Rash   ??? Mistletoe ([employer])      Allergic to All Types of tree's     Medications:  Ms. Abbee Cremeens has a current medication list which includes the following prescription(s): acetaminophen, albuterol, famotidine, hydroxychloroquine sulfate, mycophenolate mofetil, sertraline hcl, coumadin, and warfarin, and the following Facility-Administered Medications: levonorgestrel.     Physical Exam:  VITALS: BP 95/65  - Pulse 71  - Temp 36.3 ??C (97.3 ??F)  - Ht 167.6 cm (5' 5.98)  - Wt (!) 115 kg (253 lb 9.6 oz)  - SpO2 98%  - BMI 40.95 kg/m??   GEN: Well appearing, no acute distress  HEENT: Pupils equally round, sclerae anicteric, conjunctiva clear, moist mucous membranes, no oral lesions or exudates  NECK: Supple, no JVD  PULMONARY: Clear to ausculation bilaterally. Good air entry in all fileds.   CARDIOVASCULAR: Regular rate and rhythm  EXT:  No bony pain or tenderness. No lower extremity edema  NEURO: Alert and oriented, speech intact, following commands, moving all extremities well, no focal deficits appreciated    Test Results:     05/20/2020: CTA chest:  Evaluation of the distal pulmonary arteries is limited due to poor contrast opacification. Within these limits there is no evidence of large acute central pulmonary embolism.  Questionable residual chronic embolus in the left lower lobe segmental/subsegmental arteries, although evaluation is limited by contrast timing.  Faint focus of groundglass opacity in the right upper lobe which may represent atelectasis, inflammation, or infection. Attention on follow-up.      Results for orders placed or performed in visit on 09/13/20   CBC w/ Differential   Result Value Ref Range    WBC 4.7 3.6 - 11.2 10*9/L    RBC 4.84 3.95 - 5.13 10*12/L    HGB 13.3 11.3 - 14.9 g/dL    HCT 09.8 11.9 - 14.7 %    MCV 79.6 77.6 - 95.7 fL    MCH 27.5 25.9 - 32.4 pg    MCHC 34.6 32.0 - 36.0 g/dL    RDW 82.9 (H) 56.2 - 15.2 %    MPV 9.4 6.8 - 10.7 fL    Platelet 201  150 - 450 10*9/L    Neutrophils % 73.9 %    Lymphocytes % 14.3 %    Monocytes % 8.6 %    Eosinophils % 2.3 %    Basophils % 0.9 %    Absolute Neutrophils 3.5 1.8 - 7.8 10*9/L    Absolute Lymphocytes 0.7 (L) 1.1 - 3.6 10*9/L    Absolute Monocytes 0.4 0.3 - 0.8 10*9/L    Absolute Eosinophils 0.1 0.0 - 0.5 10*9/L    Absolute Basophils 0.0 0.0 - 0.1 10*9/L    Anisocytosis Slight (A) Not Present

## 2021-01-30 DIAGNOSIS — D500 Iron deficiency anemia secondary to blood loss (chronic): Principal | ICD-10-CM

## 2021-02-17 ENCOUNTER — Ambulatory Visit
Admit: 2021-02-17 | Discharge: 2021-02-18 | Disposition: A | Discharge status: Home / Self Care | Payer: MEDICARE | Attending: Emergency Medicine

## 2021-02-17 LAB — CBC W/ AUTO DIFF
BASOPHILS ABSOLUTE COUNT: 0 10*9/L (ref 0.0–0.1)
BASOPHILS RELATIVE PERCENT: 0.5 %
EOSINOPHILS ABSOLUTE COUNT: 0.1 10*9/L (ref 0.0–0.5)
EOSINOPHILS RELATIVE PERCENT: 1.6 %
HEMATOCRIT: 42.7 % (ref 34.0–44.0)
HEMOGLOBIN: 14.9 g/dL (ref 11.3–14.9)
LYMPHOCYTES ABSOLUTE COUNT: 1.2 10*9/L (ref 1.1–3.6)
LYMPHOCYTES RELATIVE PERCENT: 20.3 %
MEAN CORPUSCULAR HEMOGLOBIN CONC: 35 g/dL (ref 32.0–36.0)
MEAN CORPUSCULAR HEMOGLOBIN: 30.7 pg (ref 25.9–32.4)
MEAN CORPUSCULAR VOLUME: 87.6 fL (ref 77.6–95.7)
MEAN PLATELET VOLUME: 10.4 fL (ref 6.8–10.7)
MONOCYTES ABSOLUTE COUNT: 0.7 10*9/L (ref 0.3–0.8)
MONOCYTES RELATIVE PERCENT: 12.6 %
NEUTROPHILS ABSOLUTE COUNT: 3.8 10*9/L (ref 1.8–7.8)
NEUTROPHILS RELATIVE PERCENT: 65 %
PLATELET COUNT: 199 10*9/L (ref 150–450)
RED BLOOD CELL COUNT: 4.87 10*12/L (ref 3.95–5.13)
RED CELL DISTRIBUTION WIDTH: 13.4 % (ref 12.2–15.2)
WBC ADJUSTED: 5.9 10*9/L (ref 3.6–11.2)

## 2021-02-17 LAB — COMPREHENSIVE METABOLIC PANEL
ALBUMIN: 4.1 g/dL (ref 3.4–5.0)
ALKALINE PHOSPHATASE: 73 U/L (ref 46–116)
ALT (SGPT): 58 U/L — ABNORMAL HIGH (ref 10–49)
ANION GAP: 7 mmol/L (ref 5–14)
AST (SGOT): 47 U/L — ABNORMAL HIGH (ref <=34)
BILIRUBIN TOTAL: 1.8 mg/dL — ABNORMAL HIGH (ref 0.3–1.2)
BLOOD UREA NITROGEN: 6 mg/dL — ABNORMAL LOW (ref 9–23)
BUN / CREAT RATIO: 10
CALCIUM: 9.4 mg/dL (ref 8.7–10.4)
CHLORIDE: 108 mmol/L — ABNORMAL HIGH (ref 98–107)
CO2: 22 mmol/L (ref 20.0–31.0)
CREATININE: 0.59 mg/dL — ABNORMAL LOW
EGFR CKD-EPI (2021) FEMALE: >90 mL/min/{1.73_m2} (ref >=60)
GLUCOSE RANDOM: 92 mg/dL (ref 70–179)
POTASSIUM: 3.7 mmol/L (ref 3.4–4.8)
PROTEIN TOTAL: 7.2 g/dL (ref 5.7–8.2)
SODIUM: 137 mmol/L (ref 135–145)

## 2021-02-17 LAB — LIPASE: LIPASE: 28 U/L (ref 12–53)

## 2021-02-17 MED ADMIN — aluminum-magnesium hydroxide-simethicone (MAALOX MAX) 80-80-8 mg/mL oral suspension: 30 mL | ORAL | @ 18:00:00 | Stop: 2021-02-17

## 2021-02-17 MED ADMIN — sucralfate (CARAFATE) oral suspension: 1 g | ORAL | @ 18:00:00 | Stop: 2021-02-17

## 2021-02-17 MED ADMIN — lidocaine (XYLOCAINE) 2% viscous mucosal solution: 10 mL | ORAL | @ 18:00:00 | Stop: 2021-02-17

## 2021-02-17 MED ADMIN — acetaminophen (TYLENOL) tablet 650 mg: 650 mg | ORAL | @ 18:00:00 | Stop: 2021-02-17

## 2021-02-17 NOTE — Unmapped (Signed)
Pt coming in c/o upper mid abdominal pain that started last night. Endorses nausea and diarrhea x2 days. Hx of gallbladder surgery and lupus

## 2021-02-17 NOTE — Unmapped (Signed)
[facility]  Emergency Department Medical Screening Examination     Subjective     Melody Padilla is a 25 y.o. female with a PMH of Lupus presenting for evaluation of No chief complaint on file.. The patient reports 2-3 days of diarrhea and 1 day of mid-abdominal pain, myalgias, chills, and nausea. She notes a history of similar symptoms. Of note, she had a cholecystectomy.      Abbreviated Review of Systems/Covid Screen  Constitutional: Negative for fever  Respiratory: Negative for cough. Negative for difficulty breathing.    Objective     ED Triage Vitals [02/17/21 1400]   Enc Vitals Group      BP 123/108      Pulse       SpO2 Pulse       Resp 20      Temp 36.8 ??C (98.2 ??F)      Temp Source Oral      SpO2 100 %      Weight (!) 113.4 kg (250 lb)      Height 1.676 m (5' 6)     Focused Physical Exam  Constitutional: No acute distress.  Respiratory: Non-labored respirations.  Neurological: Clear speech. No gross focal neurologic deficits are appreciated.  ?  Assessment & Plan     Plan for basic labs, Lipase, and U-Preg.    A medical screening exam has been performed. At the time of this evaluation, no emergency medical condition requiring immediate stabilization has been identified nor is there suspicion for imminent decompensation. Appropriate triage protocols will be implemented and a comprehensive ED evaluation with disposition will be completed by a healthcare provider when an appropriate ED location becomes available. The patient is aware that this is an initial encounter only and verbalizes understanding and agreement with the plan.     Emergency Department operations continue to be impacted by the COVID-19 pandemic.     Documentation assistance was provided by  Marylene Land, Scribe, on February 17, 2021 at 1:59 PM for Ardelle Anton, MD.      Documentation assistance was provided by the scribe in my presence.  The documentation recorded by the scribe has been reviewed by me and accurately reflects the services I personally performed.

## 2021-02-18 LAB — URINALYSIS WITH CULTURE REFLEX
BILIRUBIN UA: NEGATIVE
BLOOD UA: NEGATIVE
GLUCOSE UA: NEGATIVE
KETONES UA: NEGATIVE
NITRITE UA: NEGATIVE
PH UA: 5.5 (ref 5.0–9.0)
PROTEIN UA: NEGATIVE
RBC UA: <1 /HPF (ref <=4)
SPECIFIC GRAVITY UA: 1.018 (ref 1.003–1.030)
SQUAMOUS EPITHELIAL: 4 /HPF (ref 0–5)
TRANSITIONAL EPITHELIAL: <1 /HPF (ref 0–2)
UROBILINOGEN UA: 2 — AB
WBC UA: 2 /HPF (ref 0–5)

## 2021-02-18 LAB — APTT
APTT: 98.8 s — ABNORMAL HIGH (ref 25.1–36.5)
HEPARIN CORRELATION: 0.6

## 2021-02-18 LAB — PROTIME-INR
INR: 4.71
PROTIME: 56.8 s (ref 9.8–12.8)

## 2021-02-18 LAB — PREGNANCY, URINE: PREGNANCY TEST URINE: NEGATIVE

## 2021-02-18 MED ADMIN — lactated ringers bolus 1,000 mL: 1000 mL | INTRAVENOUS | @ 04:00:00 | Stop: 2021-02-18

## 2021-02-18 NOTE — Unmapped (Signed)
[facility]  Emergency Department Provider Note    Initial Impression, Exam, Assessment and Plan, MDM     Impression: 25 y.o. female with history of Evan syndrome, antiphospholipid antibody syndrome (warfarin), iron deficiency anemia, lupus who presents the emergency department today for 24 hours of epigastric abdominal pain, nausea and multiple episodes of nonbloody diarrhea and cough.  She just returned from a family gathering in [place] 2 days ago. No known bad food/water exposure. No new meds recently. Denies any family with similar symptoms.  Surgical history significant for cholecystectomy several years ago.    Exam: Vital signs stable.  Well-appearing in no acute distress.  Normal cardiopulmonary exam.  Abdomen is soft with minimal epigastric tenderness to palpation.  No rebound or guarding.  No CVA tenderness.    A/P: 25 year old female with history as above presents the emergency department 24 hours epigastric abdominal pain, nausea and NBNB diarrhea.  Also presents with cough. Differential includes gastroenteritis vs pancreatitis vs GERD vs PUD vs UTI vs COVID among multiple other potential etiologies.  Suspect most likely viral illness.    Basic labs ordered in triage shows negative lipase (making pancreatitis less likely), electrolytes are stable, mild elevations in AST/ALT which feels stable compared to prior.  Noted mild elevation total bilirubin 1.8 (baseline 1.1 nine months) --> potentially secondary to underlying autoimmune hemolytic anemia (Evan syndrome) in context of her likely viral illness.  No leukocytosis.  No thrombocytopenia.  Urinalysis is pending.    Patient given GI cocktail and Tylenol in triage.    COVID swab added.    Do not feel she requires CT A/P given minimal epigastric tenderness without rebound or guarding and she is very well appearing in NAD.     ED course below.      ED Triage Vitals   Enc Vitals Group      BP 02/17/21 1400 123/108      Heart Rate 02/17/21 1403 80 SpO2 Pulse 02/17/21 1722 71      Resp 02/17/21 1400 20      Temp 02/17/21 1400 36.8 ??C (98.2 ??F)      Temp Source 02/17/21 1400 Oral      SpO2 02/17/21 1400 100 %      Weight 02/17/21 1400 (!) 113.4 kg (250 lb)      Height 02/17/21 1400 1.676 m (5' 6)         Diagnostic orders:    Orders Placed This Encounter   Procedures   ??? Rapid Influenza/RSV/COVID PCR   ??? Comprehensive Metabolic Panel   ??? Lipase Level   ??? CBC w/ Differential   ??? Pregnancy Qualitative, Urine (Sent to lab)   ??? Urinalysis with Culture Reflex       Progress     ED Course as of 02/18/21 1531   Fri Feb 18, 2021   0202 SARS-CoV-2 PCR: Negative   0202 Influenza B: Negative   0202 RSV: Negative   0202 WBC: 5.9   0216 Patient reassessed, she has now received 1 L IV fluids and will attempt to provide [place] with urine sample.   0457 Leukocyte Esterase, UA(!): Trace   0457 Nitrite, UA: Negative   0457 WBC, UA: 2   0457 INR: 4.71   0457 Patient reassessed, she is sleeping comfortably no acute distress following IV fluids and GI cocktail and Tylenol.  Upon waking the patient gently we discussed results of today's work-up as well as plan for discharge home.  Suspect most likely viral gastroenteritis  as cause of her symptoms today.  Patient was instructed to hold her warfarin x2 days given INR supratherapeutic here at 4.71 (goal between 2 and 3).  She was instructed to follow-up with her primary care physician on Monday (10/24) for INR recheck and further monitoring of symptoms. Given stable vitals and now no tenderness on repeat abdominal exam, do not feel CT scan is warranted. Discussed this with patient and instructed her to return to ED for any new/worsening abdominal pain or other concerning symptoms. Pt is agreeable to this.     Notably pt has had no vomiting or diarrhea here.          Disposition: discharge  _____________________________________________________    The case was discussed with the attending physician, Harriet Pho, MD, who is in agreement with the above assessment and plan.    ED Clinical Impression     Final diagnoses:   Epigastric pain (Primary)       Additional Medical Decision Making     Time seen: February 17, 2021 11:39 PM    I have reviewed the vital signs and the nursing notes. Labs and radiology results that were available during my care of the patient were independently reviewed by me and considered in my medical decision making.     I independently visualized the EKG tracing if performed  I independently visualized the radiology images if performed  I reviewed the patient's prior medical records if available.  Additional history obtained from family if available    Please note- This chart has been created using [employer]. Chart creation errors have been sought, but may not always be located and such creation errors, especially pronoun confusion, do NOT reflect on the standard of medical care.      History     Chief Complaint  Abdominal Pain      History provided by patient.       HPI   Melody Padilla is a 25 y.o. female  with history of Evan syndrome, antiphospholipid antibody syndrome (warfarin), iron deficiency anemia, lupus who presents the emergency department today for 24 hours of epigastric abdominal pain, nausea and multiple episodes of nonbloody diarrhea and cough.  She just returned from a family gathering in [place] 2 days ago.  No known bad food/water exposure or new meds recently. Denies any family with similar symptoms.  Surgical history significant for cholecystectomy several years ago. No fevers, chills, chest pain, shortness of breath, vomiting, hematochezia/melena.     ROS - 10 point review of systems was performed and is negative unless otherwise noted in HPI    CONST: Does not endorse fever/chills or changes in appetite.  NEURO: Does not endorse headache or visual disturbances.  NECK: Does not endorse neck stiffness or neck pain.   ENT: Does not endorse congestion or sore throat.  CV: Does not endorse chest pain or palpitations.   RESP: + cough  GI: + abdominal pain, nausea, vomiting, diarrhea  HEME: Does not endorse easy bruising or bleeding.   GU: Does not endorse dysuria or hematuria.   SKIN: Does not endorse rashes or lesions.   MSK: Does not endorse myalgias or arthralgias.      PAST MEDICAL HISTORY/PAST SURGICAL HISTORY:   Past Medical History:   Diagnosis Date   ??? Acute deep vein thrombosis (DVT) of femoral vein of right lower extremity (CMS-HCC) 10/12/2016   ??? Acute pulmonary embolism (CMS-HCC) 10/12/2016   ??? Anxiety    ??? Clotting disorder (CMS-HCC)    ???  CTS (carpal tunnel syndrome)    ??? Depression complicating pregnancy, antepartum 10/10/2013   ??? Eczema    ??? Evan's syndrome (CMS-HCC)    ??? Evan's syndrome (CMS-HCC)    ??? Fractures     left wrist   ??? Joint pain    ??? Lupus (CMS-HCC)    ??? Migraine    ??? Obesity    ??? Psoriasis    ??? Steatosis of liver 07/01/2014   ??? Thrombocytopenia (CMS-HCC)        Patient Active Problem List   Diagnosis   ??? Thrombocytopenia (CMS-HCC)   ??? Migraine   ??? Seasonal allergies   ??? Evans syndrome (CMS-HCC)   ??? Class 2 obesity in adult   ??? Positive ANA (antinuclear antibody)   ??? Contraception management   ??? Depression   ??? Eczema   ??? Obstructive sleep apnea syndrome in adult   ??? Pulmonary emboli (CMS-HCC)   ??? Acute deep vein thrombosis (DVT) of femoral vein of right lower extremity (CMS-HCC)   ??? Acute deep vein thrombosis (DVT) of popliteal vein of right lower extremity (CMS-HCC)   ??? Antiphospholipid antibody syndrome (CMS-HCC)   ??? Cholelithiasis   ??? DVT history on anticoagulation    ??? Epigastric pain   ??? Steatosis of liver   ??? Gastroesophageal reflux disease   ??? Infection of skin   ??? Hemolytic anemia (CMS-HCC)   ??? Jaundice   ??? Hyperbilirubinemia   ??? Long-term use of Plaquenil   ??? Emmetropia   ??? Dry eye syndrome of bilateral lacrimal glands   ??? Floaters with photopsia   ??? 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 BMI 36.0-36.9,adult   ??? Evan's syndrome (CMS-HCC)   ??? SLE (systemic lupus erythematosus) (CMS-HCC)   ??? Nausea, vomiting, and diarrhea   ??? Norovirus   ??? History of venous thromboembolism   ??? Psoriasis   ??? Other chest pain   ??? Iron deficiency anemia due to chronic blood loss       Past Surgical History:   Procedure Laterality Date   ??? CHOLECYSTECTOMY     ??? PERIPHERALLY INSERTED CENTRAL CATHETER INSERTION     ??? PR LAP,CHOLECYSTECTOMY N/A 07/09/2014    Procedure: LAPAROSCOPY, SURGICAL; CHOLECYSTECTOMY;  Surgeon: Bo Mcclintock, MD;  Location: MAIN OR [facility];  Service: Trauma   ??? PR UPPER GI ENDOSCOPY,DIAGNOSIS N/A 07/07/2014    Procedure: UGI ENDO, INCLUDE ESOPHAGUS, STOMACH, & DUODENUM &/OR JEJUNUM; DX W/WO COLLECTION SPECIMN, BY BRUSH OR WASH;  Surgeon: Donneta Romberg, MD;  Location: [facility];  Service: Gastroenterology   ??? PR WEDGE BIOPSY OF LIVER Right 07/09/2014    Procedure: BIOPSY OF LIVER;  Surgeon: Bo Mcclintock, MD;  Location: MAIN OR [facility];  Service: Trauma   ??? SKIN BIOPSY     ??? TONGUE SURGERY Midline 2006    2nd grade   ??? WISDOM TOOTH EXTRACTION         MEDICATIONS:     Current Facility-Administered Medications:   ???  lactated ringers bolus 1,000 mL, 1,000 mL, Intravenous, Once, Myrtie Hawk, MD  ???  levonorgestrel (MIRENA) 20 mcg/24 hr (5 years) IUD 1 kit, 1 each, Intrauterine, Once, Sydnee Levans, MD    Current Outpatient Medications:   ???  acetaminophen (TYLENOL) 325 MG tablet, Take 2 tablets (650 mg total) by mouth every six (6) hours as needed., Disp: 30 tablet, Rfl: 0  ???  albuterol HFA 90 mcg/actuation inhaler, Inhale 2 puffs every six (6) hours  as needed for wheezing or shortness of breath., Disp: 8 g, Rfl: 3  ???  famotidine (PEPCID) 20 MG tablet, Take 20 mg by mouth Two (2) times a day., Disp: , Rfl:   ???  hydroxychloroquine sulfate (PLAQUENIL ORAL), Take 400 mg by mouth., Disp: , Rfl:   ???  mycophenolate mofetil (CELLCEPT ORAL), Take 1,000 mg by mouth daily., Disp: , Rfl:   ???  sertraline HCl (SERTRALINE ORAL), Take 150 mg by mouth daily. , Disp: , Rfl:   ???  warfarin (COUMADIN) 2 MG tablet, Take 2 mg by mouth daily with evening meal. , Disp: , Rfl:   ???  warfarin (JANTOVEN) 5 MG tablet, Take 5 mg by mouth daily with evening meal. , Disp: , Rfl:     ALLERGIES:   Oxycodone and Mistletoe (viscum Jordan - from apple trees)    SOCIAL HISTORY:   Social History     Tobacco Use   ??? Smoking status: Never Smoker   ??? Smokeless tobacco: Never Used   Substance Use Topics   ??? Alcohol use: Yes     Comment: Occasionally   2 x  month        FAMILY HISTORY:  Family History   Problem Relation Age of Onset   ??? Diabetes Mother    ??? Hypertension Mother    ??? Cancer Mother    ??? Depression Mother    ??? Diabetes Maternal Grandmother    ??? Cancer Maternal Grandmother    ??? Hearing loss Paternal Grandmother    ??? Arthritis Cousin    ??? Asthma Cousin    ??? No Known Problems Father    ??? No Known Problems Sister    ??? No Known Problems Brother    ??? No Known Problems Maternal Aunt    ??? No Known Problems Maternal Uncle    ??? No Known Problems Paternal Aunt    ??? No Known Problems Paternal Uncle    ??? No Known Problems Maternal Grandfather    ??? No Known Problems Paternal Grandfather    ??? No Known Problems Other    ??? Blindness Neg Hx    ??? Melanoma Neg Hx    ??? Basal cell carcinoma Neg Hx    ??? Squamous cell carcinoma Neg Hx    ??? Anesthesia problems Neg Hx    ??? Broken bones Neg Hx    ??? Clotting disorder Neg Hx    ??? Collagen disease Neg Hx    ??? Dislocations Neg Hx    ??? Fibromyalgia Neg Hx    ??? Gout Neg Hx    ??? Hemophilia Neg Hx    ??? Osteoporosis Neg Hx    ??? Rheumatologic disease Neg Hx    ??? Scoliosis Neg Hx    ??? Severe sprains Neg Hx    ??? Sickle cell anemia Neg Hx    ??? Spinal Compression Fracture Neg Hx    ??? Glaucoma Neg Hx           Physical Exam     ED Triage Vitals   Enc Vitals Group      BP 02/17/21 1400 123/108      Heart Rate 02/17/21 1403 80      SpO2 Pulse 02/17/21 1722 71      Resp 02/17/21 1400 20      Temp 02/17/21 1400 36.8 ??C (98.2 ??F)      Temp Source 02/17/21 1400 Oral      SpO2 02/17/21 1400 100 %  Weight 02/17/21 1400 (!) 113.4 kg (250 lb)      Height 02/17/21 1400 1.676 m (5' 6)       EXAM     CONST: Well appearing, well nourished, NAD.  HEENT: [place]/AT, conjunctivae clear, moist mucous membranes.  NECK: Trachea midline.  Neck supple.  CHEST WALL: Non-tender to palpation.  CV: RRR, no m/g/r. 2+ radial pulses bilaterally. No pedal edema.  RESP: Normal WOB. Lungs CTAB without wheezes or crackles.   ABD: Abdomen soft, minimal epigastric tenderness to palpation, non-distended. No rebound or guarding. No masses.  BACK: Atraumatic. No midline tenderness. No CVA tenderness.   MSK: No gross joint deformities. Ambulatory.  SKIN: Warm and well perfused. No rashes or lesions.   HEME: No petechiae or bruising.  PSYCH: Mood and affect are normal.  NEURO: Awake, alert. No appreciated facial droop, dysarthria, or aphasia. Moves all extremities spontaneously.      Radiology     No orders to display     No results found.     LABORATORY DATA:     Results for orders placed or performed during the hospital encounter of 02/17/21   Rapid Influenza/RSV/COVID PCR    Specimen: Nasopharyngeal Swab   Result Value Ref Range    SARS-CoV-2 PCR Negative Negative    Influenza A Negative Negative    Influenza B Negative Negative    RSV Negative Negative   Comprehensive Metabolic Panel   Result Value Ref Range    Sodium 137 135 - 145 mmol/L    Potassium 3.7 3.4 - 4.8 mmol/L    Chloride 108 (H) 98 - 107 mmol/L    CO2 22.0 20.0 - 31.0 mmol/L    Anion Gap 7 5 - 14 mmol/L    BUN 6 (L) 9 - 23 mg/dL    Creatinine 9.56 (L) 0.60 - 0.80 mg/dL    BUN/Creatinine Ratio 10     eGFR CKD-EPI (2021) Female >90 >=60 mL/min/1.87m2    Glucose 92 70 - 179 mg/dL    Calcium 9.4 8.7 - 21.3 mg/dL    Albumin 4.1 3.4 - 5.0 g/dL    Total Protein 7.2 5.7 - 8.2 g/dL    Total Bilirubin 1.8 (H) 0.3 - 1.2 mg/dL    AST 47 (H) <=08 U/L    ALT 58 (H) 10 - 49 U/L    Alkaline Phosphatase 73 46 - 116 U/L   Lipase Level   Result Value Ref Range    Lipase 28 12 - 53 U/L   Pregnancy Qualitative, Urine (Sent to lab)   Result Value Ref Range    Pregnancy Test, Urine Negative Negative   Urinalysis with Culture Reflex    Specimen: Clean Catch; Urine   Result Value Ref Range    Color, UA Yellow     Clarity, UA Clear     Specific Gravity, UA 1.018 1.003 - 1.030    pH, UA 5.5 5.0 - 9.0    Leukocyte Esterase, UA Trace (A) Negative    Nitrite, UA Negative Negative    Protein, UA Negative Negative    Glucose, UA Negative Negative    Ketones, UA Negative Negative    Urobilinogen, UA 2.0 mg/dL (A) <6.5 mg/dL    Bilirubin, UA Negative Negative    Blood, UA Negative Negative    RBC, UA <1 <=4 /HPF    WBC, UA 2 0 - 5 /HPF    Squam Epithel, UA 4 0 - 5 /HPF    Bacteria, UA Rare (  A) None Seen /HPF    Trans Epithel, UA <1 0 - 2 /HPF    Mucus, UA Occasional (A) None Seen /HPF   APTT   Result Value Ref Range    APTT 98.8 (H) 25.1 - 36.5 sec    Heparin Correlation 0.6    PT-INR   Result Value Ref Range    PT 56.8 (HH) 9.8 - 12.8 sec    INR 4.71    CBC w/ Differential   Result Value Ref Range    WBC 5.9 3.6 - 11.2 10*9/L    RBC 4.87 3.95 - 5.13 10*12/L    HGB 14.9 11.3 - 14.9 g/dL    HCT 16.1 09.6 - 04.5 %    MCV 87.6 77.6 - 95.7 fL    MCH 30.7 25.9 - 32.4 pg    MCHC 35.0 32.0 - 36.0 g/dL    RDW 40.9 81.1 - 91.4 %    MPV 10.4 6.8 - 10.7 fL    Platelet 199 150 - 450 10*9/L    Neutrophils % 65.0 %    Lymphocytes % 20.3 %    Monocytes % 12.6 %    Eosinophils % 1.6 %    Basophils % 0.5 %    Absolute Neutrophils 3.8 1.8 - 7.8 10*9/L    Absolute Lymphocytes 1.2 1.1 - 3.6 10*9/L    Absolute Monocytes 0.7 0.3 - 0.8 10*9/L    Absolute Eosinophils 0.1 0.0 - 0.5 10*9/L    Absolute Basophils 0.0 0.0 - 0.1 10*9/L        Dustin Folks Azyiah Bo, MD  Resident  02/18/21 [phone]

## 2021-02-21 ENCOUNTER — Ambulatory Visit: Admit: 2021-02-21 | Discharge: 2021-02-22 | Payer: MEDICARE

## 2021-02-21 DIAGNOSIS — M329 Systemic lupus erythematosus, unspecified: Principal | ICD-10-CM

## 2021-02-21 DIAGNOSIS — D6941 Evans syndrome: Principal | ICD-10-CM

## 2021-02-21 LAB — COMPREHENSIVE METABOLIC PANEL
ALBUMIN: 4.1 g/dL (ref 3.4–5.0)
ALKALINE PHOSPHATASE: 89 U/L (ref 46–116)
ALT (SGPT): 93 U/L — ABNORMAL HIGH (ref 10–49)
ANION GAP: 12 mmol/L (ref 5–14)
AST (SGOT): 58 U/L — ABNORMAL HIGH (ref <=34)
BILIRUBIN TOTAL: 0.9 mg/dL (ref 0.3–1.2)
BLOOD UREA NITROGEN: 8 mg/dL — ABNORMAL LOW (ref 9–23)
BUN / CREAT RATIO: 13
CALCIUM: 9.6 mg/dL (ref 8.7–10.4)
CHLORIDE: 106 mmol/L (ref 98–107)
CO2: 19 mmol/L — ABNORMAL LOW (ref 20.0–31.0)
CREATININE: 0.62 mg/dL
EGFR CKD-EPI (2021) FEMALE: >90 mL/min/{1.73_m2} (ref >=60)
GLUCOSE RANDOM: 97 mg/dL (ref 70–179)
POTASSIUM: 3.8 mmol/L (ref 3.4–4.8)
PROTEIN TOTAL: 7.1 g/dL (ref 5.7–8.2)
SODIUM: 137 mmol/L (ref 135–145)

## 2021-02-21 LAB — URINALYSIS WITH CULTURE REFLEX
BILIRUBIN UA: NEGATIVE
BLOOD UA: NEGATIVE
GLUCOSE UA: NEGATIVE
KETONES UA: NEGATIVE
NITRITE UA: NEGATIVE
PH UA: 5.5 (ref 5.0–9.0)
PROTEIN UA: NEGATIVE
RBC UA: 0 /HPF (ref 0–3)
SPECIFIC GRAVITY UA: >=1.03 (ref 1.005–1.030)
SQUAMOUS EPITHELIAL: 4 /HPF (ref 0–5)
UROBILINOGEN UA: 0.2
WBC UA: 4 /HPF — ABNORMAL HIGH (ref 0–3)

## 2021-02-21 LAB — CBC W/ AUTO DIFF
BASOPHILS ABSOLUTE COUNT: 0.1 10*9/L (ref 0.0–0.1)
BASOPHILS RELATIVE PERCENT: 0.8 %
EOSINOPHILS ABSOLUTE COUNT: 0.2 10*9/L (ref 0.0–0.5)
EOSINOPHILS RELATIVE PERCENT: 3.1 %
HEMATOCRIT: 43.6 % (ref 34.0–44.0)
HEMOGLOBIN: 15.2 g/dL — ABNORMAL HIGH (ref 11.3–14.9)
LYMPHOCYTES ABSOLUTE COUNT: 1.1 10*9/L (ref 1.1–3.6)
LYMPHOCYTES RELATIVE PERCENT: 15.4 %
MEAN CORPUSCULAR HEMOGLOBIN CONC: 34.9 g/dL (ref 32.0–36.0)
MEAN CORPUSCULAR HEMOGLOBIN: 30.9 pg (ref 25.9–32.4)
MEAN CORPUSCULAR VOLUME: 88.5 fL (ref 77.6–95.7)
MEAN PLATELET VOLUME: 10 fL (ref 6.8–10.7)
MONOCYTES ABSOLUTE COUNT: 0.6 10*9/L (ref 0.3–0.8)
MONOCYTES RELATIVE PERCENT: 9 %
NEUTROPHILS ABSOLUTE COUNT: 5 10*9/L (ref 1.8–7.8)
NEUTROPHILS RELATIVE PERCENT: 71.7 %
PLATELET COUNT: 235 10*9/L (ref 150–450)
RED BLOOD CELL COUNT: 4.92 10*12/L (ref 3.95–5.13)
RED CELL DISTRIBUTION WIDTH: 13.5 % (ref 12.2–15.2)
WBC ADJUSTED: 7 10*9/L (ref 3.6–11.2)

## 2021-02-21 LAB — PROTEIN / CREATININE RATIO, URINE
CREATININE, URINE: 85.5 mg/dL
PROTEIN URINE: 6.9 mg/dL
PROTEIN/CREAT RATIO, URINE: 0.081

## 2021-02-21 LAB — C3 COMPLEMENT: C3 COMPLEMENT: 134 mg/dL (ref 84–160)

## 2021-02-21 LAB — C4 COMPLEMENT: C4 COMPLEMENT: 19.2 mg/dL (ref 12.0–36.0)

## 2021-02-21 MED ORDER — HYDROXYCHLOROQUINE 200 MG TABLET
ORAL_TABLET | Freq: Every day | ORAL | 3 refills | 90 days | Status: CP
Start: 2021-02-21 — End: 2022-02-21
  Filled 2021-02-24: qty 180, 90d supply, fill #0

## 2021-02-21 MED ORDER — MYCOPHENOLATE MOFETIL 500 MG TABLET
ORAL_TABLET | Freq: Every day | ORAL | 6 refills | 30.00000 days | Status: CP
Start: 2021-02-21 — End: 2021-02-21
  Filled 2021-02-24: qty 360, 90d supply, fill #0

## 2021-02-21 NOTE — Unmapped (Signed)
[facility] Clinical Assessment & Refill Coordination Note    Demetress Tift, [place]: 1996-02-12  Phone: [phone] (home)     All above HIPAA information was verified with patient.     Was a Nurse, learning disability used for this call? No    Specialty Medication(s):   Inflammatory Disorders: mycophenolate     Current Outpatient Medications   Medication Sig Dispense Refill   ??? acetaminophen (TYLENOL) 325 MG tablet Take 2 tablets (650 mg total) by mouth every six (6) hours as needed. 30 tablet 0   ??? albuterol HFA 90 mcg/actuation inhaler Inhale 2 puffs every six (6) hours as needed for wheezing or shortness of breath. 8 g 3   ??? famotidine (PEPCID) 20 MG tablet Take 20 mg by mouth Two (2) times a day.     ??? hydrOXYchloroQUINE (PLAQUENIL) 200 mg tablet Take 2 tablets (400 mg total) by mouth daily. 180 tablet 3   ??? mycophenolate (CELLCEPT) 500 mg tablet Take 2 tablets (1,000 mg total) by mouth in the morning. 60 tablet 6   ??? sertraline HCl (SERTRALINE ORAL) Take 150 mg by mouth daily.      ??? warfarin (COUMADIN) 2 MG tablet Take 2 mg by mouth daily with evening meal.      ??? warfarin (JANTOVEN) 5 MG tablet Take 5 mg by mouth daily with evening meal.        Current Facility-Administered Medications   Medication Dose Route Frequency Provider Last Rate Last Admin   ??? levonorgestrel (MIRENA) 20 mcg/24 hr (5 years) IUD 1 kit  1 each Intrauterine Once Sydnee Levans, MD            Changes to medications: Billie reports no changes at this time.    Allergies   Allergen Reactions   ??? Oxycodone Shortness Of Breath, Itching and Rash   ??? Mistletoe (Viscum [employer] - From [employer])      Allergic to All Types of tree's       Changes to allergies: No    SPECIALTY MEDICATION ADHERENCE     Mycophenolate 500 mg: 14 days of medicine on hand           Specialty medication(s) dose(s) confirmed: Regimen is correct and unchanged.     Are there any concerns with adherence? Yes: patient has been using MMF once daily only, discussed with MD today at appt    Adherence counseling provided? Not needed    CLINICAL MANAGEMENT AND INTERVENTION      Clinical Benefit Assessment:    Do you feel the medicine is effective or helping your condition? Patient declined to answer    Clinical Benefit counseling provided? Progress note from 10/24 shows evidence of clinical benefit    Adverse Effects Assessment:    Are you experiencing any side effects? No    Are you experiencing difficulty administering your medicine? No    Quality of Life Assessment:    Quality of Life    Rheumatology  Oncology  Dermatology  Cystic Fibrosis          How many days over the past month did your sLE  keep you from your normal activities? For example, brushing your teeth or getting up in the morning. 0    Have you discussed this with your provider? Not needed    Acute Infection Status:    Acute infections noted within Epic:  No active infections  Patient reported infection: None    Therapy Appropriateness:  Is therapy appropriate and patient progressing towards therapeutic goals? Yes, therapy is appropriate and should be continued    DISEASE/MEDICATION-SPECIFIC INFORMATION      N/A    PATIENT SPECIFIC NEEDS     - Does the patient have any physical, cognitive, or cultural barriers? No    - Is the patient high risk? No    - Does the patient require a Care Management Plan? No     - Does the patient require physician intervention or other additional services (i.e. nutrition, smoking cessation, social work)? No      SHIPPING     Specialty Medication(s) to be Shipped:   Inflammatory Disorders: mycophenolate    Other medication(s) to be shipped: HCQ     Changes to insurance: No    Delivery Scheduled: Yes, Expected medication delivery date: 10/28.  However, Rx request for refills was sent to the provider as there are none remaining.     Medication will be delivered via Next Day Courier to the confirmed prescription address in [facility].    The patient will receive a drug information handout for each medication shipped and additional FDA Medication Guides as required.  Verified that patient has previously received a Conservation officer, historic buildings and a Surveyor, mining.    The patient or caregiver noted above participated in the development of this care plan and knows that they can request review of or adjustments to the care plan at any time.      All of the patient's questions and concerns have been addressed.    Julianne Rice   [facility] Specialty Pharmacist

## 2021-02-21 NOTE — Unmapped (Signed)
[facility] visit    Assessment and Plan:    25 y.o. 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     Last seen 03/2020    She is currently prescribed HCQ 400 mg daily (ran out 1 month ago) and MMF 1000 mg BID ( only taking 1000 mg qhs). 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  She is on warfarin given her h/o APLA.     Overall her lupus sound well controlled without clinical signs of activity today despite not being on recommended therapy.  Recommend updating labs and re-starting these medications.  Will order via [facility] given her reported issues with cost.   She is noted to have recent GI/diarrheal illness since returning from [place]. She is improving but was advised to follow-up with PCP if symptoms worsen.  Does have several lesions on b/l lower extremities consistent with arthropod bite (mosquito) but low suspicion for mosquito borne illness given lack of fever and/or other symptoms.      1. SLE   - Check SLE monitoring labs today: CBC, CMP, dsDNA, C3, C4, UPA, UA  - Restart MMF 1000 mg BID; counseled importance of adherence with this medication for control of her lupus; discussed strategies to improve adherence with AM dose (patient frequently forgets this dose) by using habit stacking method (placing pills near her toothbrush so that she takes at the same time as brushing her teeth);   - Restart HCQ 400 mg daily; okay to take as a single dose (as opposed to 200 mg BID to improve adherence as patient finds she has more success when she takes both pills at the same time); she is due for HCQ eye exam and has been encouraged to set up follow-up appt  - See below for rituximab 1000 mg IV x 1 q6 months  - continue voltaren gel for joints    2. APLA, Evan Syndrome and h/o iron deficiency anemia  - Follows closely with hematology   - Continue warfarin as prescribed for treatment of her APLA.  - Given her refractory Evan Syndrome, which more recently has manifested by recurrent ITP, will plan for rituximab 1000 mg IV x1 q6 months as maintenance (in addition to her MMF above); due in 03/2021 and patient is aware   - Reports experiencing infusion reaction with iron infusion; patient will follow-up with heme for further recommendations for treatment     3. Chest pain, elevated troponin and hospitalization 11/2019; dx with type II MI;  Initial concern for myopericarditis but with normal TTE and cMRI; PET  11/2019; no recurrent sx; follow up with cardiology and PCP     HealthCare Maintenance:  - Immunizations:                            Immunization History   Administered Date(s) Administered   ??? COVID-19 VACCINE,MRNA(MODERNA)(PF)(IM) 12/01/2019, 12/29/2019   ??? DTaP 05/29/1996, 07/31/1996, 10/02/1996, 04/14/1997, 06/04/2000   ??? HPV Quadrivalent (Gardasil) 09/23/2008, 11/25/2008, 03/31/2009   ??? Haemophilis Influenza Type B Vaccine Hboc 05/29/1996, 07/31/1996, 10/02/1996, 04/14/1997   ??? Hepatitis A Vaccine - Unspecified Formulation 06/04/2007   ???  Hepatitis A Vaccine Pediatric / Adolescent 2 Dose IM 12/11/2007   ??? Hepatitis B Vaccine, Unspecified Formulation 1995-11-12, 05/29/1996, 10/02/1996   ??? INFLUENZA TIV (TRI) 53MO+ W/ PRESERV (IM) 03/31/2009, 06/15/2010, 05/25/2011, 03/27/2012   ??? Influenza LAIV (Nasal-Tri) HISTORICAL 02/17/2008   ??? Influenza Vaccine Quad (IIV4 PF) 63mo+ injectable 03/20/2014, 02/01/2017, 01/25/2018, 01/22/2019, 03/18/2020   ??? Influenza Vaccine Quad (IIV4 W/PRESERV) 53MO+ 05/07/2013   ??? Influenza Virus Vaccine, unspecified formulation 05/05/2013   ??? MMR 04/14/1997, 06/04/2000   ??? Meningococcal ACWY, Unspecified Formulation 06/04/2007, 10/03/2012   ??? Meningococcal B Vaccine, OMV Adjuvanted 01/10/2018   ??? Meningococcal C Conjugate 10/03/2012   ??? OPV 05/29/1996, 07/31/1996, 10/02/1996   ??? PNEUMOCOCCAL POLYSACCHARIDE 23 10/03/2012   ??? Pneumococcal, Unspecified Formulation 10/03/2012   ??? Poliovirus,inactivated (IPV) 06/04/2000   ??? SHINGRIX-ZOSTER VACCINE (HZV), RECOMBINANT,SUB-UNIT,ADJUVANTED IM 02/12/2019   ??? TdaP 12/11/2007, 10/10/2013, 01/10/2018   ??? Varicella 09/07/1998, 12/11/2007     I personally spent 35 minutes face-to-face and non-face-to-face in the care of this patient, which includes all pre, intra, and post visit time on the date of service.    ________________________________________________    Reason for visit: Follow up of lupus and Evan's syndrome    HPI: Ms. Shawnie Dapper is a 25 y.o. 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  She is a former patient of Dr. Renford Dills who transitioned into Dr. Chaya Jan clinic in 12/2017.    Last seen 03/2020      At present, she has been prescribed HCQ 400 mg daily and MMF 1000 mg BID.  She is receiving q6 month rituximab (1000 mg x 1 q6 months,last 08/2020; largely for her Evan Syndrome).  She is on warfarin given her h/o APLA.    Interim History:    She says she was in [place] for about 2 weeks visiting family.  She was feeling well there. However, she returned to the [place] and suffered worsening abdominal pain. She notes she has had decreased appetitie. No fevers.  She reports some diarrhea initially that has gotten better. She is still having epigastric belly pain though. She has noticed this is more painful when she eats.  No blood in stool.   No yellowing of skin or eyes.     She notes that she has not been taking her plaquenil b/c she ran out about a month ago.   She reports taking cellcept but only takes this once a day as she often forgets.    She says that she did stop her medications for a while back in the spring/summer due to depression.     She reports multiple mosquito bites about two weeks ago.  She notes that these have been quite itchy.  She says this occurred while she was in [place].  She notes it has taken a while for these to clear up. She denies any new areas.   No other skin rashes.     No numbness or tingling in feet.  No weakness.  No bleeding or bruising. She says that she is taking her anticoagulation.      She says she has been noticing her bones and joints are hurting a bit more.     No changes in urine. No blood.  No frothy urine.     Rheumatologic History:  Briefly, she developed  Evan's syndrome in 06/2012, and was treated with corticosteroid, IVIG, vincristine, and rituximab. Rituximab (4 weekly doses in July/August 2014) was effective and blood counts remained normal for quite some time. She established with [facility] in 2015 at which time she was [redacted] weeks pregnant (delivered baby without complications). experiencing arthralgias without evidence of frank synovitis.  Her autoimmune work-up revealed + ANA (1:160), equivocal SSA (repeat negative).  She has had very low positive dsDNA and mild hypocomplementemia in the past as well.  She was started on plaquenil for treatment of her SLE in 2015. APS studies were positive.  Developed DVT in 09/2016 and has been on chronic anticoagulation ever since. She was admitted to [facility] hospital in 12/2017 with flare of her SLE and WAIHA.  She was treated with methylprednisolone 1 g x 3 doses (01/27/18 - 01/29/18) with subsequent prednisone taper and, Rituximab 375 mg/m^2 ( 01/27/18, 02/11/18, 02/18/18 and 02/25/18).  Her cell counts  responded well and prednisone tapered.  She was started on AZA in 2020 given concern for ongoing SLE activity and arthralgias, ultimately increased to 200 mg daily in 12/2018.  In 01/2019 and 03/2019, suffered worsening Evan's and was treated with steroids, IVIG and rituximab given refractory ITP.  She was hospitalized again in 07/2019 for her Evan Syndrome - primarily manifested as ITP.  She was treated with dexamethasone, IVIG and rituximab (4/16 and 08/28/2019). After consultation with rheumatology IP team,  AZA was switched to MMF given her refractory disease.  Per heme, recommend maintenance with ritux 1000 mg x 2 q64months    Treatment Hx:    HCQ 200 mg BID (or 400 mg daily) - started 2015  AZA 100mg  --> 200 mg   Rituximab   - 375 mg/m2 ( 10/28/2012;  11/06/2012; 11/13/2012; 11/20/2012)  - 375 mg/m2 (02/11/2018; 02/18/2018; 02/25/2018)  - 1000 mg  x 2 (02/11/19, 02/27/19; 08/15/19, 08/28/19  - 1000 mg x 1 (03/01/2020, 08/30/2020)        ROS: As per HPI, otherwise 10 system review negative    Record review:  I have reviewed the patient's allergies, medications, pertinent past medical, surgical, social and family history and have updated in Epic where appropriate.     Past Medical History:   Diagnosis Date   ??? Acute deep vein thrombosis (DVT) of femoral vein of right lower extremity (CMS-HCC) 10/12/2016   ??? Acute pulmonary embolism (CMS-HCC) 10/12/2016   ??? Anxiety    ??? Clotting disorder (CMS-HCC)    ??? CTS (carpal tunnel syndrome)    ??? Depression complicating pregnancy, antepartum 10/10/2013   ??? Eczema    ??? Evan's syndrome (CMS-HCC)    ??? Evan's syndrome (CMS-HCC)    ??? Fractures     left wrist   ??? Joint pain    ??? Lupus (CMS-HCC)    ??? Migraine    ??? Obesity    ??? Psoriasis    ??? Steatosis of liver 07/01/2014   ??? Thrombocytopenia (CMS-HCC)      Current Outpatient Medications on File Prior to Visit   Medication Sig Dispense Refill   ??? acetaminophen (TYLENOL) 325 MG tablet Take 2 tablets (650 mg total) by mouth every six (6) hours as needed. 30 tablet 0   ??? famotidine (PEPCID) 20 MG tablet Take 20 mg by mouth Two (2) times a day.     ??? sertraline HCl (SERTRALINE ORAL) Take 150 mg by mouth daily.      ??? warfarin (COUMADIN) 2 MG tablet Take 2 mg by  mouth daily with evening meal.      ??? warfarin (JANTOVEN) 5 MG tablet Take 5 mg by mouth daily with evening meal.      ??? albuterol HFA 90 mcg/actuation inhaler Inhale 2 puffs every six (6) hours as needed for wheezing or shortness of breath. 8 g 3     Current Facility-Administered Medications on File Prior to Visit   Medication Dose Route Frequency Provider Last Rate Last Admin   ??? levonorgestrel (MIRENA) 20 mcg/24 hr (5 years) IUD 1 kit  1 each Intrauterine Once Sydnee Levans, MD         Allergies   Allergen Reactions   ??? Oxycodone Shortness Of Breath, Itching and Rash   ??? Mistletoe ([employer])      Allergic to All Types of tree's     Past Surgical History:   Procedure Laterality Date   ??? CHOLECYSTECTOMY     ??? PERIPHERALLY INSERTED CENTRAL CATHETER INSERTION     ??? PR LAP,CHOLECYSTECTOMY N/A 07/09/2014    Procedure: LAPAROSCOPY, SURGICAL; CHOLECYSTECTOMY;  Surgeon: Bo Mcclintock, MD;  Location: MAIN OR [facility];  Service: Trauma   ??? PR UPPER GI ENDOSCOPY,DIAGNOSIS N/A 07/07/2014    Procedure: UGI ENDO, INCLUDE ESOPHAGUS, STOMACH, & DUODENUM &/OR JEJUNUM; DX W/WO COLLECTION SPECIMN, BY BRUSH OR WASH;  Surgeon: Donneta Romberg, MD;  Location: GI [facility]URES [facility];  Service: Gastroenterology   ??? PR WEDGE BIOPSY OF LIVER Right 07/09/2014    Procedure: BIOPSY OF LIVER;  Surgeon: Bo Mcclintock, MD;  Location: MAIN OR [facility];  Service: Trauma   ??? SKIN BIOPSY     ??? TONGUE SURGERY Midline 2006    2nd grade   ??? WISDOM TOOTH EXTRACTION       Family History   Problem Relation Age of Onset   ??? Diabetes Mother    ??? Hypertension Mother    ??? Cancer Mother    ??? Depression Mother    ??? Diabetes Maternal Grandmother    ??? Cancer Maternal Grandmother    ??? Hearing loss Paternal Grandmother    ??? Arthritis Cousin    ??? Asthma Cousin    ??? No Known Problems Father    ??? No Known Problems Sister    ??? No Known Problems Brother    ??? No Known Problems Maternal Aunt    ??? No Known Problems Maternal Uncle    ??? No Known Problems Paternal Aunt    ??? No Known Problems Paternal Uncle    ??? No Known Problems Maternal Grandfather    ??? No Known Problems Paternal Grandfather    ??? No Known Problems Other    ??? Blindness Neg Hx    ??? Melanoma Neg Hx    ??? Basal cell carcinoma Neg Hx    ??? Squamous cell carcinoma Neg Hx    ??? Anesthesia problems Neg Hx    ??? Broken bones Neg Hx    ??? Clotting disorder Neg Hx    ??? Collagen disease Neg Hx    ??? Dislocations Neg Hx    ??? Fibromyalgia Neg Hx    ??? Gout Neg Hx    ??? Hemophilia Neg Hx    ??? Osteoporosis Neg Hx    ??? Rheumatologic disease Neg Hx    ??? Scoliosis Neg Hx    ??? Severe sprains Neg Hx    ??? Sickle cell anemia Neg Hx    ??? Spinal Compression Fracture Neg Hx    ???  Glaucoma Neg Hx      Social History     Tobacco Use   ??? Smoking status: Never Smoker   ??? Smokeless tobacco: Never Used   Vaping Use   ??? Vaping Use: Never used   Substance Use Topics   ??? Alcohol use: Yes     Comment: Occasionally   2 x  month    ??? Drug use: No       Physical Examination:    Vitals:    02/21/21 1009   BP: 114/74   Pulse: 82   Temp: 36.2 ??C (97.1 ??F)       Gen: NAD, alert and interactive  Eyes: EOMI, Anicteric sclera, non injected conjunctiva  ENT: No oral ulcers, moist oral mucosa, no parotid enlargement  Respiratory: CTAB, normal WOB, symmetric chest expansion  CVS: RRR, no murmurs  Skin: scattered arthropod bites on b/l lower extremities (primarily on posterior lower leg/calves)  Neuro: AAO X 3, strength symmetrical bilaterally  MSK: Shoulders, elbows, wrists without tenderness or swelling, Small joints of hands with out swelling Bilateral knees cool with out effusion and with FROM, ankles w/o swelling and non tender, MTP squeeze non tender  Psych: Mood and affect appropriate and congruent      Labs/Imaging/Other:    Lab Results   Component Value Date    WBC 5.9 02/17/2021    HGB 14.9 02/17/2021    HCT 42.7 02/17/2021    PLT 199 02/17/2021       Lab Results   Component Value Date    NA 137 02/17/2021    K 3.7 02/17/2021    CL 108 (H) 02/17/2021    CO2 22.0 02/17/2021    BUN 6 (L) 02/17/2021    CREATININE 0.59 (L) 02/17/2021    GLU 92 02/17/2021    CALCIUM 9.4 02/17/2021    MG 2.0 12/26/2019    PHOS 3.7 01/28/2018       Lab Results   Component Value Date    BILITOT 1.8 (H) 02/17/2021    BILIDIR <0.10 08/14/2019    PROT 7.2 02/17/2021    ALBUMIN 4.1 02/17/2021    ALT 58 (H) 02/17/2021    AST 47 (H) 02/17/2021    ALKPHOS 73 02/17/2021    GGT 52 (H) 10/02/2014       Lab Results   Component Value Date    INR 4.71 02/18/2021    APTT 98.8 (H) 02/18/2021       X ray R knee: No acute osseous abnormality, right knee

## 2021-02-21 NOTE — Unmapped (Signed)
Clinical Assessment Needed For: Dose Change  Medication: Mycophenolate 500mg  tablet  Last Fill Date/Day Supply: 10/13/2020 / 30 days  Copay $1.35  Was previous dose already scheduled to fill: No    Notes to Pharmacist: Hydorxychloroquine - $1.35

## 2021-02-21 NOTE — Unmapped (Signed)
You were seen by [facility] today.      [facility]  [address]   [place], [place] [address]  Phone:  [phone]  Fax:  [phone]     Rheumatologist:  Dr. Ilsa Iha       Summary of Plan for 02/21/21    Diagnostic testing recommendations:  Labs today (in clinic or on the first floor of the [employer])    Therapeutic / Treatment recommendations:   Restart your plaquenil and your cellcept as before. I have sent these to your pharmacy     Referrals and follow-up recommendations:  Please follow-up with PCP and hematology as scheduled     When should I expect results?   Please note that it may take up to 21 days for results to return.  If you have not been notified by phone, mail or [facility] (if applicable) in 21 days, please contact our office at [phone] or via [facility] messaging [email or web address])     What do I do if I have questions or concerns after the visit?   If you have non-urgent questions, the best way to contact your provider is to send a MyChart message by visiting [email or web address].  You can also use MyChart to request refills and access test results.  Providers will do their best to respond within 2-3 business days, although it may take longer in some circumstances.  If you have immediate concerns, please contact our clinic by phone  at [phone].     Thank you for allowing [facility] to be involved in your care!

## 2021-02-23 LAB — ANTI-DNA ANTIBODY, DOUBLE-STRANDED: DSDNA ANTIBODY: NEGATIVE

## 2021-03-18 ENCOUNTER — Ambulatory Visit: Admit: 2021-03-18 | Discharge: 2021-03-19 | Payer: MEDICARE

## 2021-03-18 NOTE — Unmapped (Signed)
Patient presented for scheduled Ferrlecit infusion, however she states she is supposed to be receiving Rituxan today. Explained that appt was for Ferrlecit and that we are unable to do Rituxan today. Also, therapy plan is past due for review and this must be completed before plan can be released. Appt scheduled for Rituxan and message sent to provider for plan to be reviewed.

## 2021-03-22 NOTE — Unmapped (Signed)
patient has enough medication on hand, rescheduling refill call for 11/9

## 2021-03-31 ENCOUNTER — Ambulatory Visit: Admit: 2021-03-31 | Discharge: 2021-04-01 | Payer: MEDICARE

## 2021-03-31 DIAGNOSIS — D6861 Antiphospholipid syndrome: Principal | ICD-10-CM

## 2021-03-31 DIAGNOSIS — D590 Drug-induced autoimmune hemolytic anemia: Principal | ICD-10-CM

## 2021-03-31 DIAGNOSIS — D6941 Evans syndrome: Principal | ICD-10-CM

## 2021-03-31 LAB — CBC W/ AUTO DIFF
BASOPHILS ABSOLUTE COUNT: 0 10*9/L (ref 0.0–0.1)
BASOPHILS RELATIVE PERCENT: 0.4 %
EOSINOPHILS ABSOLUTE COUNT: 0.1 10*9/L (ref 0.0–0.5)
EOSINOPHILS RELATIVE PERCENT: 2.1 %
HEMATOCRIT: 42.4 % (ref 34.0–44.0)
HEMOGLOBIN: 14.7 g/dL (ref 11.3–14.9)
LYMPHOCYTES ABSOLUTE COUNT: 1.1 10*9/L (ref 1.1–3.6)
LYMPHOCYTES RELATIVE PERCENT: 20.4 %
MEAN CORPUSCULAR HEMOGLOBIN CONC: 34.6 g/dL (ref 32.0–36.0)
MEAN CORPUSCULAR HEMOGLOBIN: 30.7 pg (ref 25.9–32.4)
MEAN CORPUSCULAR VOLUME: 88.6 fL (ref 77.6–95.7)
MEAN PLATELET VOLUME: 10 fL (ref 6.8–10.7)
MONOCYTES ABSOLUTE COUNT: 0.5 10*9/L (ref 0.3–0.8)
MONOCYTES RELATIVE PERCENT: 10.3 %
NEUTROPHILS ABSOLUTE COUNT: 3.6 10*9/L (ref 1.8–7.8)
NEUTROPHILS RELATIVE PERCENT: 66.8 %
PLATELET COUNT: 191 10*9/L (ref 150–450)
RED BLOOD CELL COUNT: 4.79 10*12/L (ref 3.95–5.13)
RED CELL DISTRIBUTION WIDTH: 12.9 % (ref 12.2–15.2)
WBC ADJUSTED: 5.3 10*9/L (ref 3.6–11.2)

## 2021-03-31 MED ADMIN — diphenhydrAMINE (BENADRYL) injection 25 mg: 25 mg | INTRAVENOUS | @ 17:00:00 | Stop: 2021-03-31

## 2021-03-31 MED ADMIN — methylPREDNISolone sodium succinate (PF) (Solu-MEDROL) injection 125 mg: 125 mg | INTRAVENOUS | @ 14:00:00 | Stop: 2021-03-31

## 2021-03-31 MED ADMIN — riTUXimab-abbs (TRUXIMA) 1,000 mg in sodium chloride (NS) 0.9 % 500 mL IVPB: 1000 mg | INTRAVENOUS | @ 15:00:00 | Stop: 2021-03-31

## 2021-03-31 MED ADMIN — sodium chloride 0.9% (NS) bolus 1,000 mL: 1000 mL | INTRAVENOUS | @ 17:00:00 | Stop: 2021-03-31

## 2021-03-31 MED ADMIN — acetaminophen (TYLENOL) tablet 650 mg: 650 mg | ORAL | @ 14:00:00 | Stop: 2021-03-31

## 2021-03-31 MED ADMIN — diphenhydrAMINE (BENADRYL) injection 50 mg: 50 mg | INTRAVENOUS | @ 14:00:00 | Stop: 2021-03-31

## 2021-03-31 NOTE — Unmapped (Signed)
Patient presents for initial Truxima (rituximab-abbs) infusion.  In no acute distress.  Vitals stable.  Reports no new medical issues or S/S of infection.  IV started.  See MAR for premeds.    WBC 5.3  PLT 191    0958 Truxima (rituximab-abbs) 1000 mg started to infuse as follows:     1155 Pt reports itchiness in back of throat and nose. Infusion paused, fluids started.    1206 Pt reports itchiness has not improved. Benadryl 25mg    Given    1212 Pt reports itchiness better but still present    1224 Itchiness totally resolved. Truxima restarted.    50 mg/hr for 30 min   100 mg/hr for 30 min  150 mg/hr for 30 min  200 mg/hr for 30 min  250 mgl/hr for 30 min  300 mgl/hr for 30 min  350 mg/hr for 30 min  400 mg/hr for the rest of the infusion.    1453 Truxima (rituximab-abbs) infusion complete. PIV flushed with NS.  Vitals stable.  Patient without any s/s of adverse reaction. IV d/c'd.  Patient discharged from Infusion Center.

## 2021-05-09 ENCOUNTER — Ambulatory Visit: Admit: 2021-05-09 | Discharge: 2021-05-10 | Payer: MEDICARE | Attending: Internal Medicine | Primary: Internal Medicine

## 2021-05-09 DIAGNOSIS — D6941 Evans syndrome: Principal | ICD-10-CM

## 2021-05-09 DIAGNOSIS — R21 Rash and other nonspecific skin eruption: Principal | ICD-10-CM

## 2021-05-09 DIAGNOSIS — D6861 Antiphospholipid syndrome: Principal | ICD-10-CM

## 2021-05-09 LAB — COMPREHENSIVE METABOLIC PANEL
ALBUMIN: 4.1 g/dL (ref 3.4–5.0)
ALKALINE PHOSPHATASE: 56 U/L (ref 46–116)
ALT (SGPT): 58 U/L — ABNORMAL HIGH (ref 10–49)
ANION GAP: 9 mmol/L (ref 5–14)
AST (SGOT): 38 U/L — ABNORMAL HIGH (ref <=34)
BILIRUBIN TOTAL: 1.3 mg/dL — ABNORMAL HIGH (ref 0.3–1.2)
BLOOD UREA NITROGEN: 6 mg/dL — ABNORMAL LOW (ref 9–23)
BUN / CREAT RATIO: 10
CALCIUM: 9.8 mg/dL (ref 8.7–10.4)
CHLORIDE: 105 mmol/L (ref 98–107)
CO2: 24.8 mmol/L (ref 20.0–31.0)
CREATININE: 0.58 mg/dL — ABNORMAL LOW
EGFR CKD-EPI (2021) FEMALE: >90 mL/min/{1.73_m2} (ref >=60)
GLUCOSE RANDOM: 98 mg/dL (ref 70–179)
POTASSIUM: 4.3 mmol/L (ref 3.4–4.8)
PROTEIN TOTAL: 7.3 g/dL (ref 5.7–8.2)
SODIUM: 139 mmol/L (ref 135–145)

## 2021-05-09 LAB — CBC W/ AUTO DIFF
BASOPHILS ABSOLUTE COUNT: 0 10*9/L (ref 0.0–0.1)
BASOPHILS RELATIVE PERCENT: 0.7 %
EOSINOPHILS ABSOLUTE COUNT: 0.2 10*9/L (ref 0.0–0.5)
EOSINOPHILS RELATIVE PERCENT: 3.4 %
HEMATOCRIT: 43.2 % (ref 34.0–44.0)
HEMOGLOBIN: 15 g/dL — ABNORMAL HIGH (ref 11.3–14.9)
LYMPHOCYTES ABSOLUTE COUNT: 0.9 10*9/L — ABNORMAL LOW (ref 1.1–3.6)
LYMPHOCYTES RELATIVE PERCENT: 16 %
MEAN CORPUSCULAR HEMOGLOBIN CONC: 34.8 g/dL (ref 32.0–36.0)
MEAN CORPUSCULAR HEMOGLOBIN: 30.7 pg (ref 25.9–32.4)
MEAN CORPUSCULAR VOLUME: 88.3 fL (ref 77.6–95.7)
MEAN PLATELET VOLUME: 10.3 fL (ref 6.8–10.7)
MONOCYTES ABSOLUTE COUNT: 0.6 10*9/L (ref 0.3–0.8)
MONOCYTES RELATIVE PERCENT: 11.2 %
NEUTROPHILS ABSOLUTE COUNT: 3.7 10*9/L (ref 1.8–7.8)
NEUTROPHILS RELATIVE PERCENT: 68.7 %
PLATELET COUNT: 207 10*9/L (ref 150–450)
RED BLOOD CELL COUNT: 4.89 10*12/L (ref 3.95–5.13)
RED CELL DISTRIBUTION WIDTH: 13.1 % (ref 12.2–15.2)
WBC ADJUSTED: 5.3 10*9/L (ref 3.6–11.2)

## 2021-05-09 LAB — D-DIMER, QUANTITATIVE: D-DIMER QUANTITATIVE (CW,ML,HL,HS,CH,JS,JC,RX): <215 ng{FEU}/mL (ref <=500)

## 2021-05-09 MED ORDER — RIVAROXABAN 20 MG TABLET
ORAL_TABLET | Freq: Every day | ORAL | 4 refills | 30.00000 days | Status: CP
Start: 2021-05-09 — End: 2022-05-09

## 2021-05-09 MED ORDER — TRIAMCINOLONE ACETONIDE 0.5 % TOPICAL OINTMENT
Freq: Two times a day (BID) | TOPICAL | 0 refills | 0 days | Status: CP
Start: 2021-05-09 — End: 2022-05-09

## 2021-05-09 NOTE — Unmapped (Unsigned)
Hematology Clinic Visit    PCP: Melody Padilla     Reason for visit: Follow-up Melody Padilla & Melody Padilla    Assessment and Recommendations:  Melody Padilla is a 26 y.o. female with Melody Padilla & Melody Padilla who presents for follow-up.     1. Melody Padilla:  Treatment history  Prior flare in 2014 and was treated with steroids, IVIG, Rituximab, and ultimately vincristine for control.    Has experienced multiple flares over the last 2 years.    (i) She was hospitalized 12/2017 with WAIHA flare.  Her platelets on admission were wnl. She received methylprednisolone 1 g x 3 doses (01/27/18 - 01/29/18), Rituximab 375 mg/m^2 (x4 01/27/18-02/25/18). She had a prolonged steroid taper and was started on Azathioprine as a steroid sparing agent by Rheumatology.   (ii) Melody flares in October and November 2020 treated with 4 days of IVIG, 4 days of high-dose dexamethasone each time, and 1 g rituximab x2 doses (completed 02/27/2019).  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     Doing well from the Melody Padilla standpoint.    Last Rituxan in 03/2021. Due for next dose in June 2023.     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 Relates this to missed doses.   Continue to follow with PCP for this. INR check in clinic today. She will communicate results to PCP.  On Rituxan q6 months for maintenance. Last dose in Dec 2022, next will be in June 2023 as above.    Transition to rivaroxaban todaty.   Will repeat APLA labs - last was in 2018.     4. SLE  Primary manifestations include joint pains.    She follows with [facility] (Melody Padilla).  She is currently on Plaquenil and Cellcept.     Not adherent - wants to switch to biologics iv if possible.     Needs a therapist.       Will schedule follow up visit with hematology in 3 months. ---------------------------------------------------------------------------------------    History of Present Illness:  Melody Padilla is a 26 y.o. Hispanic woman with SLE, triple positive APLAS (on warfarin) and Melody Padilla (WAIHA + ITP) who is being seen at the hematology clinic today for follow-up.  I last saw Melody Padilla in clinic on 01/20/2021. Kindly refer to my clinic note for full details.     Background history:  Melody Padilla was hospitalized at [facility] from June 13-21, 2018.  She was admitted for evaluation of right lower extremity and groin pain and found to have ileofemoral DVT and pulmonary embolism.  She was initiated on anticoagulation and has remained on this following confirmation of the diagnosis of antiphospholipid Padilla.  ??  Melody Padilla was subsequently hospitalized from 01/25/18 - 01/29/18 with WAIHA flare.  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  Her platelets were appropriate at that time.  She received IV methylpred for 3 days from 9/29 - 10/1.  She also started on Rituximab 9/29 with plan for weekly infusion.  Her Hgb on day of discharge was 9.2.  Bilirubin trended down to 3.1, and LDH to 1002 on day of discharge.   ??  At the time of her follow-up visit with me in June 2020, Melody Padilla  reported being well since discharge from the hospital.  She was started on Azathiprine by Melody Padilla. She was tolerating this well. She was evaluated at a local ED in April 2020 for left sided non-pleuritic chest pain and reportedly had a negative cardiac and PE work up (we do not have the results).  3 weeks earlier she noted increasing fatigue, lightheadedness and occasional bruising that was more excessive than what she has had with her INR being therapeutic. She was not using NSAIDs and no over the counter medications and apart from a recent 4 day period where she ran out of her medications, she was very compliant.  I recommended continuing the current treatment plan at that time.     Ms. [name] was found to have a flare of Melody Padilla in the later part of September 2020.  Platelets progressively decreased and were 30,000 by February 03, 2019.  I advised her to hold anticoagulation and started her on prednisone 40 mg daily but her thrombocytopenia continue to progress and she was hospitalized at [facility] from 10/12-10/24/2020 for management.  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  She was treated with a 4-day course of IVIG, 4 days of high-dose dexamethasone, and dose number 1 out of 2 of 1 g rituximab (02/11/2019).  Platelets 117,000 on discharge.  Anticoagulation was resumed with warfarin on Lovenox bridge.  ??  At the time of her visit with me in early November 2020, Melody Padilla was continuing to bridge with Lovenox while awaiting therapeutic INR on warfarin.  Given extensive bruising over her abdominal wall from the Lovenox injections, I have recommended that she stop the Lovenox and just take the warfarin.  She reported continue to feel fatigued and difficulty with insomnia.  She was not discharged on any steroids.  She had the second dose of rituximab at the hematology clinic on 02/27/2019.  CBC at that time showed normal hemoglobin, platelets of 96,000.  INR was 2.29.  I recommended continuing weekly CBCs, which she typically had drawn at the [facility] clinic.  ??  Unfortunately, Melody Padilla was again hospitalized from November 21 to March 25, 2019 for flare of ITP.  She developed epistaxis x3 and some gingival bleeding and labs revealed a platelet count of 9.  She reported to [facility] and was hospitalized.  She was treated with steroids and IVIG. Home azathioprine and plaquenil were continued throughout admission per rheumatology.  She was discharged on a 4-week taper of prednisone starting at 40 mg once daily.  Platelet count on day of discharge is 103,000.  She was discharged on Metformin with instructions to take it as long as she was taking prednisone.  This was due to elevated blood sugars observed during the hospitalization while on steroids.  Melody Padilla reports doing well since discharge from the bleeding standpoint.  This week is the last of her prednisone taper.  She is understandably concerned about whether she would relapse now that she is coming off steroids.  She has gained about 10 pounds over the last 2 months from repeated courses of steroids.  She remains on warfarin.  She has not had any labs in almost 2 weeks.  Last platelet count was greater than 200,000 and INR was therapeutic.  She continues to have some shortness of breath with ADLs and minimal exertion.  This has been ongoing for a number of months now.  Melody Padilla was sick with COVID-19 in June.     She was hospitalized from 7/6-11/06/2019 with heavy menstrual bleeding. Hgb was at baseline 12.2 with normal platelets. INR was 6.95 and she was administered vitamin K and discharged.    She saw ID on 11/12/2019 & dermatology on 11/19/19 regarding skin lesions which were assessed to be likely cutaneous lupus vs eczematous dermatitis.     End of July 2021 she hit her head on the car frame as she was entering and had a severe headache. She went to the ED but left before evaluation as the wait was taking too long. Her headache has since improved substantially and she denies any vision change, tinnitus, nausea/vomiting, or weakness.     [place] [employer] trip in [place] Uneventful trip.     Developed persistent joint pains and chest pain in August 2021 - admitted to [facility].  Underwent extensive evaluation -- thought to be related to lupus and possibility of type 2 MI raised as well. She did not undergo cardiac cath as the likelihood of finding significant coronary disease is low given age. INR was subtherapeutic upon admission and she was bridged with lovenox.     Scheduled for iron infusion following last visit with me. Received it on 02/04/20 but aborted as she developed chest pain 10 mins into the test dose. Required epi-pen.     At the time of her visit in Dec 2021, Melody Padilla reported feeling very tired. Gets dizzy and suffers HAs multiple days a week.  She was in the ER the day before visit for bleeding from the toes - INR at 6.45. She reported continued episodes of CPs 1-2x/day. Last 10 secs or so, spontaneously resolve. Has had work-up for this in the past, all negative. Gained ~ 10 lbs over the last 3 months.  Attributed this to decreased activity (fatigue) and diet.     Melody Padilla was in the ER for sore throat and shortness of breath in Jan 2022.  Work-up included CTA - no new PEs. Was found to have COVID. Lasted ~ 10 days. The SOB persisted for weeks after.     At the time of her visit with me in March 2022, Melody Padilla reported that she has CPAP but was not using. Doing well from the anticoagulation standpoint.  INR last week was 2.5. Had started going to the gym - skipped this week given fatigue and mood was also down, feels may be her depression. Remains on plaquenil and cellcept.  Was started famotidine and resumed sertraline this month - had stopped it in Nov 2021.   I ordered her next Rituxan maintenance dose for May 2022 and also ordered Ferrlicit 250 mg IV x 4 doses to correct her iron deficiency.     Melody Padilla received 3 doses of ferrlicit in April/May 2022.     Got Rituxan in May 2022 as planned - next dose in November 2022.   Developed a runny nose and itchy throat with rituxan and ferrlicit dose #3 - resolved with benadryl.  Therefore dose #4 of ferrlicit was deferred.     At the time of her visit with me in June 2022, Melody Padilla reported continuing to feel fatigued. Struggling with depression - split with BF in May. She was not using CPAP.  She was yet to get a covid booster.  Planning trip to [place] in October 2022.     At the time of her visit  with me in 12/2020, Melody Padilla reported she stopped a number of her meds for ~ 1 month because her depression got really bad.   She was now back on all meds and depression is 'better, sort of'. Still dealing with a lot of life stressors.  Her son was started on ADHD meds last week and these were making him drowsy.   No bleeding issues since last visit.  Last INR was 1.6 2 weeks ago. No dose increase as this was likely secondary to missed doses.   Going to [place] October 4 for 2 weeks. Getting her booster today.   Nov dose of Ritxan is yet to be scheduled.  CBC 2 weeks normal - will get records.     Interval history and Review of Systems:  Received Rituxan 1g on 03/31/2021. Next dose in June 2023. CBC normal on that day.   Saw Rheum in 01/2021.  Got bivalent covid booster in 12/2020.   Two trips to [place] - Oct and Dec. Got a GI bug during the second trip. Recovered now.   Wants to stop ALL meds - tired of taking them.  Has been off them for the last 2 days.   Rash over the neck x 2 weeks. Very itchy. Feels this may be stress related. Keeps it clean, applying vaseline/lotion.   No ankle swelling.   A 12 systems ROS was otherwise negative.       Past Medical History:   Diagnosis Date   ??? Acute deep vein thrombosis (DVT) of femoral vein of right lower extremity (CMS-HCC) 10/12/2016   ??? Acute pulmonary embolism (CMS-HCC) 10/12/2016   ??? Anxiety    ??? Clotting disorder (CMS-HCC)    ??? CTS (carpal tunnel Padilla)    ??? Depression complicating pregnancy, antepartum 10/10/2013   ??? Eczema    ??? Melody Padilla (CMS-HCC)    ??? Melody Padilla (CMS-HCC)    ??? Fractures     left wrist   ??? Joint pain    ??? Lupus (CMS-HCC)    ??? Migraine    ??? Obesity    ??? Psoriasis    ??? Steatosis of liver 07/01/2014   ??? Thrombocytopenia (CMS-HCC)        Family History   Problem Relation Age of Onset   ??? Diabetes Mother    ??? Hypertension Mother    ??? Cancer Mother    ??? Depression Mother    ??? Diabetes Maternal Grandmother    ??? Cancer Maternal Grandmother    ??? Hearing loss Paternal Grandmother    ??? Arthritis Cousin    ??? Asthma Cousin    ??? No Known Problems Father    ??? No Known Problems Sister    ??? No Known Problems Brother    ??? No Known Problems Maternal Aunt    ??? No Known Problems Maternal Uncle    ??? No Known Problems Paternal Aunt    ??? No Known Problems Paternal Uncle    ??? No Known Problems Maternal Grandfather    ??? No Known Problems Paternal Grandfather    ??? No Known Problems Other    ??? Blindness Neg Hx    ??? Melanoma Neg Hx    ??? Basal cell carcinoma Neg Hx    ??? Squamous cell carcinoma Neg Hx    ??? Anesthesia problems Neg Hx    ??? Broken bones Neg Hx    ??? Clotting disorder Neg Hx    ??? Collagen disease Neg Hx    ???  Dislocations Neg Hx    ??? Fibromyalgia Neg Hx    ??? Gout Neg Hx    ??? Hemophilia Neg Hx    ??? Osteoporosis Neg Hx    ??? Rheumatologic disease Neg Hx    ??? Scoliosis Neg Hx    ??? Severe sprains Neg Hx    ??? Sickle cell anemia Neg Hx    ??? Spinal Compression Fracture Neg Hx    ??? Glaucoma Neg Hx      Social History  Socioeconomic History   ??? Marital status: Single     Spouse name: Not on file   ??? Number of children: Not on file   ??? Years of education: Not on file   ??? Highest education level: Not on file   Occupational History   ??? 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 Smoking status: Never Smoker   ??? Smokeless tobacco: Never Used   Vaping Use   ??? Vaping Use: Never used   Substance and Sexual Activity   ??? Alcohol use: Yes     Comment: Occasionally   2 x  month    ??? Drug use: No   ??? Sexual activity: Yes     Partners: Male     Birth control/protection: I.U.D.   Other Topics Concern   ??? Do you use sunscreen? Yes   ??? Tanning bed use? No   ??? Are you easily burned? No   ??? Excessive sun exposure? No   ??? Blistering sunburns? No   Social History Narrative    Lives with parents.  Has a son who is 77 years old.      Allergies:  Allergies   Allergen Reactions   ??? Oxycodone Shortness Of Breath, Itching and Rash   ??? Mistletoe ([employer])      Allergic to All Types of tree's     Medications:  Ms. Jerusalem Brownstein has a current medication list which includes the following prescription(s): albuterol prn, famotidine, hydroxychloroquine, mycophenolate, sertraline hcl, warfarin, and levonorgestrel.     Physical Exam:  VITALS: BP 101/66 (BP Site: L Arm, BP Position: Sitting, BP Cuff Size: Large)  - Pulse 60  - Temp 36.2 ??C (97.1 ??F) (Temporal)  - Ht 167.6 cm (5' 5.98)  - Wt (!) 115.5 kg (254 lb 9.6 oz)  - SpO2 98%  - BMI 41.11 kg/m??   GEN: Well appearing, no acute distress  HEENT: Pupils equally round, sclerae anicteric, conjunctiva clear, moist mucous membranes, no oral lesions or exudates  NECK: Supple, no JVD  PULMONARY: Clear to ausculation bilaterally. Good air entry in all fileds.   CARDIOVASCULAR: Regular rate and rhythm  EXT:  No bony pain or tenderness. No lower extremity edema.  Erythematous rash over the left shoulder and upper chest. Not raised, no lesion or vesicles.   NEURO: Alert and oriented, speech intact, following commands, moving all extremities well, no focal deficits appreciated    Test Results:     05/20/2020: CTA chest:  Evaluation of the distal pulmonary arteries is limited due to poor contrast opacification. Within these limits there is no evidence of large acute central pulmonary embolism.  Questionable residual chronic embolus in the left lower lobe segmental/subsegmental arteries, although evaluation is limited by contrast timing.  Faint focus of groundglass opacity in the right upper lobe which may represent atelectasis, inflammation, or infection. Attention on follow-up.      Results for orders  placed or performed in visit on 03/31/21   CBC w/ Differential   Result Value Ref Range    WBC 5.3 3.6 - 11.2 10*9/L    RBC 4.79 3.95 - 5.13 10*12/L    HGB 14.7 11.3 - 14.9 g/dL    HCT 09.8 11.9 - 14.7 %    MCV 88.6 77.6 - 95.7 fL    MCH 30.7 25.9 - 32.4 pg MCHC 34.6 32.0 - 36.0 g/dL    RDW 82.9 56.2 - 13.0 %    MPV 10.0 6.8 - 10.7 fL    Platelet 191 150 - 450 10*9/L    Neutrophils % 66.8 %    Lymphocytes % 20.4 %    Monocytes % 10.3 %    Eosinophils % 2.1 %    Basophils % 0.4 %    Absolute Neutrophils 3.6 1.8 - 7.8 10*9/L    Absolute Lymphocytes 1.1 1.1 - 3.6 10*9/L    Absolute Monocytes 0.5 0.3 - 0.8 10*9/L    Absolute Eosinophils 0.1 0.0 - 0.5 10*9/L    Absolute Basophils 0.0 0.0 - 0.1 10*9/L

## 2021-05-10 LAB — CARDIOLIPIN ANTIBODY, IGM/IGG
ANTICARDIOLIPIN IGG ANTIBODY: 46 [GPL'U] — ABNORMAL HIGH (ref 0.0–23.0)
ANTICARDIOLIPIN IGM ANTIBODY: 3 [MPL'U] (ref 0.0–11.0)

## 2021-05-10 LAB — BETA-2 GLYCOPROTEIN ANTIBODIES
BETA-2 GLYCOPROTEIN 1 IGG ANTIBODY: 123 — ABNORMAL HIGH (ref <20.0)
BETA-2 GLYCOPROTEIN 1 IGM ANTIBODY: 3 (ref <20.0)

## 2021-05-12 LAB — LUPUS INHIBITOR PANEL
DILUTE RUSSELL VIPER VENOM TIME CONF: 44.4 s
DILUTE RUSSELL VIPER VENOM TIME: 75.6 s — ABNORMAL HIGH (ref 0.0–46.2)
DRVVT NORMALIZED RATIO: 1.75 — ABNORMAL HIGH (ref 0.00–1.20)
PTT LUPUS ANTICOAGULANT: 98.7 s — ABNORMAL HIGH (ref 0.0–49.3)

## 2021-05-12 LAB — HEXAG PHOSPHOLIPID APTT
HPN DIFFERENCE: 53.6 s — ABNORMAL HIGH (ref 0.0–7.9)
WITH PHOSPHOLIPID: 60 s

## 2021-05-13 LAB — LUPUS INHIBITOR PANEL
DILUTE RUSSELL VIPER VENOM TIME CONF: 44.4 s
DILUTE RUSSELL VIPER VENOM TIME: 75.6 s — ABNORMAL HIGH (ref 0.0–46.2)
DRVVT NORMALIZED RATIO: 1.75 — ABNORMAL HIGH (ref 0.00–1.20)
PTT LUPUS ANTICOAGULANT: 98.7 s — ABNORMAL HIGH (ref 0.0–49.3)

## 2021-05-19 NOTE — Unmapped (Signed)
Patient is changing medications will follow up on 2/9

## 2021-05-28 ENCOUNTER — Ambulatory Visit
Admit: 2021-05-28 | Discharge: 2021-05-29 | Disposition: A | Discharge status: Home / Self Care | Payer: MEDICARE | Attending: Emergency Medicine

## 2021-05-28 ENCOUNTER — Emergency Department
Admit: 2021-05-28 | Discharge: 2021-05-29 | Disposition: A | Discharge status: Home / Self Care | Payer: MEDICARE | Attending: Emergency Medicine

## 2021-05-28 DIAGNOSIS — R519 Acute nonintractable headache, unspecified headache type: Principal | ICD-10-CM

## 2021-05-28 LAB — CBC W/ AUTO DIFF
BASOPHILS ABSOLUTE COUNT: 0 10*9/L (ref 0.0–0.1)
BASOPHILS RELATIVE PERCENT: 0.6 %
EOSINOPHILS ABSOLUTE COUNT: 0.1 10*9/L (ref 0.0–0.5)
EOSINOPHILS RELATIVE PERCENT: 2.9 %
HEMATOCRIT: 40.4 % (ref 34.0–44.0)
HEMOGLOBIN: 14.1 g/dL (ref 11.3–14.9)
LYMPHOCYTES ABSOLUTE COUNT: 1.2 10*9/L (ref 1.1–3.6)
LYMPHOCYTES RELATIVE PERCENT: 24.9 %
MEAN CORPUSCULAR HEMOGLOBIN CONC: 35 g/dL (ref 32.0–36.0)
MEAN CORPUSCULAR HEMOGLOBIN: 30.5 pg (ref 25.9–32.4)
MEAN CORPUSCULAR VOLUME: 87 fL (ref 77.6–95.7)
MEAN PLATELET VOLUME: 9.7 fL (ref 6.8–10.7)
MONOCYTES ABSOLUTE COUNT: 0.6 10*9/L (ref 0.3–0.8)
MONOCYTES RELATIVE PERCENT: 12.2 %
NEUTROPHILS ABSOLUTE COUNT: 2.8 10*9/L (ref 1.8–7.8)
NEUTROPHILS RELATIVE PERCENT: 59.4 %
PLATELET COUNT: 185 10*9/L (ref 150–450)
RED BLOOD CELL COUNT: 4.64 10*12/L (ref 3.95–5.13)
RED CELL DISTRIBUTION WIDTH: 13 % (ref 12.2–15.2)
WBC ADJUSTED: 4.8 10*9/L (ref 3.6–11.2)

## 2021-05-28 LAB — BASIC METABOLIC PANEL
ANION GAP: 11 mmol/L (ref 5–14)
BLOOD UREA NITROGEN: 7 mg/dL — ABNORMAL LOW (ref 9–23)
BUN / CREAT RATIO: 11
CALCIUM: 9.2 mg/dL (ref 8.7–10.4)
CHLORIDE: 109 mmol/L — ABNORMAL HIGH (ref 98–107)
CO2: 22 mmol/L (ref 20.0–31.0)
CREATININE: 0.61 mg/dL
EGFR CKD-EPI (2021) FEMALE: >90 mL/min/{1.73_m2} (ref >=60)
GLUCOSE RANDOM: 83 mg/dL (ref 70–179)
POTASSIUM: 3.9 mmol/L (ref 3.4–4.8)
SODIUM: 142 mmol/L (ref 135–145)

## 2021-05-28 LAB — HCG QUANTITATIVE, BLOOD: GONADOTROPIN, CHORIONIC (HCG) QUANT: <2.6 m[IU]/mL

## 2021-05-28 MED ADMIN — butalbital-acetaminophen-caffeine (ESGIC) per tablet 2 tablet: 2 | ORAL | @ 21:00:00 | Stop: 2021-05-28

## 2021-05-28 MED ADMIN — diphenhydrAMINE (BENADRYL) injection 25 mg: 25 mg | INTRAVENOUS | @ 20:00:00 | Stop: 2021-05-28

## 2021-05-28 MED ADMIN — acetaminophen (TYLENOL) tablet 650 mg: 650 mg | ORAL | Stop: 2021-05-28

## 2021-05-28 MED ADMIN — prochlorperazine (COMPAZINE) injection 10 mg: 10 mg | INTRAVENOUS | @ 20:00:00 | Stop: 2021-05-28

## 2021-05-28 MED ADMIN — magnesium sulfate in water 2 gram/50 mL (4 %) IVPB 2 g: 2 g | INTRAVENOUS | @ 23:00:00 | Stop: 2021-05-28

## 2021-05-28 MED ADMIN — lactated ringers bolus 1,000 mL: 1000 mL | INTRAVENOUS | @ 21:00:00 | Stop: 2021-05-28

## 2021-05-28 NOTE — Unmapped (Addendum)
Pt complains of five migraines over the past week, with pressure in head and nausea/vomiting. Pt is on blood thinners but hasn't taken them in approx 1.5 weeks. Pt also reports pain around left eye starting yesterday.

## 2021-05-28 NOTE — Unmapped (Signed)
Pt having a headache

## 2021-05-28 NOTE — Unmapped (Signed)
[facility]  Emergency Department Medical Screening Examination     Subjective     Melody Padilla Melody Padilla is a 26 y.o. female presenting for evaluation of Migraine. Patient reports around 1 week of persistent migraines with auras, photophobia, and nausea. She currently rates her headache as 10/10. She has tried Excedrin, Tylenol, and other home remedies. She also endorses pressure behind her left eye, which is not typical of her past migraines. Never been on medications for migraines. Patient takes blood thinners for her antiphospholipid syndrome, but states that her last dose was around 1.5 weeks ago. Endorses double vision in her left visual field. No weakness in extremities or neck pain.    Abbreviated Review of Systems/Covid Screen  Constitutional: Negative for fever  Respiratory: Negative for cough. Negative for difficulty breathing.    Objective     ED Triage Vitals [05/28/21 1258]   Enc Vitals Group      BP 139/71      Heart Rate 72      SpO2 Pulse       Resp 16      Temp 36.7 ??C (98 ??F)      Temp Source Oral      SpO2 100 %     Focused Physical Exam  Constitutional: No acute distress.  Respiratory: Non-labored respirations.  Neurological: Clear speech. No gross focal neurologic deficits are appreciated.  Leftward gaze. Double vision.     Assessment & Plan     DDx includes but not limited to migraine vs headache vs less likely DVT thrombosis    Patient is hemodynamically stable. Appropriate triage labs ordered. Will defer the remainder of workup to the main ED provider once bed becomes available.     A medical screening exam has been performed. At the time of this evaluation, no emergency medical condition requiring immediate stabilization has been identified nor is there suspicion for imminent decompensation. Appropriate triage protocols will be implemented and a comprehensive ED evaluation with disposition will be completed by a healthcare provider when an appropriate ED location becomes available. The patient is aware that this is an initial encounter only and verbalizes understanding and agreement with the plan.     Emergency Department operations continue to be impacted by the COVID-19 pandemic.     ______________________________________________________________   Documentation assistance was provided by Charlsie Quest, Scribe, on May 28, 2021 at 12:59 PM for Stevphen Rochester, MD.    A scribe was used when documenting this visit. I agree with the above documentation. Signed by  Pedro Earls on  May 29, 2021 at 6:52 PM

## 2021-05-29 MED ADMIN — iohexoL (OMNIPAQUE) 350 mg iodine/mL solution 100 mL: 100 mL | INTRAVENOUS | @ 04:00:00 | Stop: 2021-05-28

## 2021-05-29 NOTE — Unmapped (Signed)
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 Patient reports 1 week of intermittent migrainous symptoms, similar to prior migraine episodes. She denies unilateral weakness, vision loss, dysarthria, other neurologic symptoms.     Vitals within normal limits. Patient is well-appearing, non-toxic. NAD. Normal neurologic exam as outlined below.     Symptoms seem to be most consistent with typical migraine. Lower suspicion for venous sinus thrombosis given waxing and waning symptoms, although patient has been off of Xarelto for 1 week. However, symptoms preceded medication non-compliance. Meningitis, other infectious etiology of symptoms also less likely in absence of fevers, neck pain, and overall well clinical appearance.     Will treat patient with fioricet, compazine, benadryl, and 1 L bolus.     Additional Progress Notes    1600: Patient feels improved after medication but reports linegring headache. Discussed risks and benefits of further imaging vs discharging with return precautions. Patient is concerned given her history of APS and would like to proceed with CT Venogram to evaluate for venous sinus thrombosis. Will give magnesium and additional 650 of tylenol given ongoing headache.     1900: Patient signed out to Dr. Misty Stanley pending CT Venogram. If negative for CVST anticipate discharge with strict return precautions and outpatient management.       Portions of this record have been created using Scientist, clinical (histocompatibility and immunogenetics). Dictation errors have been sought, but may not have been identified and corrected.    See chart and resident provider documentation for details.    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 Patient reports 1 week of intermittent migrainous symptoms, similar to prior migraine episodes. She denies unilateral weakness, vision loss, dysarthria, other neurologic symptoms. She was compliant with anticoagulation until the past week when she has missed her doses of Xarelto. She reports that it's difficult to remember to take it because she has to take it with meals. Has been using tylenol at home without significant relief. Denies fevers, neck pain, URI symptoms, cough.        External Records Reviewed  Prior ED notes    Past Medical History:   Diagnosis Date   ??? Acute deep vein thrombosis (DVT) of femoral vein of right lower extremity (CMS-HCC) 10/12/2016   ??? Acute pulmonary embolism (CMS-HCC) 10/12/2016   ??? Anxiety    ??? Clotting disorder (CMS-HCC)    ??? CTS (carpal tunnel syndrome)    ??? Depression complicating pregnancy, antepartum 10/10/2013   ??? Eczema    ??? Evan's syndrome (CMS-HCC)    ??? Evan's syndrome (CMS-HCC)    ??? Fractures     left wrist   ??? Joint pain    ??? Lupus (CMS-HCC)    ??? Migraine    ??? Obesity    ??? Psoriasis    ???  Steatosis of liver 07/01/2014   ??? Thrombocytopenia (CMS-HCC)        Patient Active Problem List   Diagnosis   ??? Thrombocytopenia (CMS-HCC)   ??? Migraine   ??? Seasonal allergies   ??? Evans syndrome (CMS-HCC)   ??? Class 2 obesity in adult   ??? Positive ANA (antinuclear antibody)   ??? Contraception management   ??? Depression   ??? Eczema   ??? Obstructive sleep apnea syndrome in adult   ??? Pulmonary emboli (CMS-HCC)   ??? Acute deep vein thrombosis (DVT) of femoral vein of right lower extremity (CMS-HCC)   ??? Acute deep vein thrombosis (DVT) of popliteal vein of right lower extremity (CMS-HCC)   ??? Antiphospholipid antibody syndrome (CMS-HCC)   ??? Cholelithiasis   ??? DVT history on anticoagulation    ??? Epigastric pain   ??? Steatosis of liver   ??? Gastroesophageal reflux disease   ??? Infection of skin   ??? Hemolytic anemia (CMS-HCC)   ??? Jaundice   ??? Hyperbilirubinemia   ??? Long-term use of Plaquenil   ??? Emmetropia   ??? Dry eye syndrome of bilateral lacrimal glands   ??? Floaters with photopsia   ??? 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 BMI 36.0-36.9,adult   ??? Evan's syndrome (CMS-HCC)   ??? SLE (systemic lupus erythematosus) (CMS-HCC)   ??? Nausea, vomiting, and diarrhea   ??? Norovirus   ??? History of venous thromboembolism   ??? Psoriasis   ??? Other chest pain   ??? Iron deficiency anemia due to chronic blood loss   ??? Rash       Past Surgical History:   Procedure Laterality Date   ??? CHOLECYSTECTOMY     ??? PERIPHERALLY INSERTED CENTRAL CATHETER INSERTION     ??? PR LAP,CHOLECYSTECTOMY N/A 07/09/2014    Procedure: LAPAROSCOPY, SURGICAL; CHOLECYSTECTOMY;  Surgeon: Bo Mcclintock, MD;  Location: MAIN OR [facility];  Service: Trauma   ??? PR UPPER GI ENDOSCOPY,DIAGNOSIS N/A 07/07/2014    Procedure: UGI ENDO, INCLUDE ESOPHAGUS, STOMACH, & DUODENUM &/OR JEJUNUM; DX W/WO COLLECTION SPECIMN, BY BRUSH OR WASH;  Surgeon: Donneta Romberg, MD;  [facility];  Service: Gastroenterology   ??? PR WEDGE BIOPSY OF LIVER Right 07/09/2014    Procedure: BIOPSY OF LIVER;  Surgeon: Bo Mcclintock, MD;  Location: MAIN OR [facility];  Service: Trauma   ??? SKIN BIOPSY     ??? TONGUE SURGERY Midline 2006    2nd grade   ??? WISDOM TOOTH EXTRACTION           Current Facility-Administered Medications:   ???  levonorgestrel (MIRENA) 20 mcg/24 hr (5 years) IUD 1 kit, 1 each, Intrauterine, Once, Sydnee Levans, MD    Current Outpatient Medications:   ???  acetaminophen (TYLENOL) 325 MG tablet, Take 2 tablets (650 mg total) by mouth every six (6) hours as needed., Disp: 30 tablet, Rfl: 0  ???  albuterol HFA 90 mcg/actuation inhaler, Inhale 2 puffs every six (6) hours as needed for wheezing or shortness of breath., Disp: 8 g, Rfl: 3  ???  famotidine (PEPCID) 20 MG tablet, Take 20 mg by mouth Two (2) times a day., Disp: , Rfl:   ???  hydrOXYchloroQUINE (PLAQUENIL) 200 mg tablet, Take 2 tablets (400 mg total) by mouth daily., Disp: 180 tablet, Rfl: 3  ???  mycophenolate (CELLCEPT) 500 mg tablet, Take 2 tablets (1,000 mg total) by mouth Two (2) times a day., Disp: 360 tablet, Rfl: 0  ???  rivaroxaban (  XARELTO) 20 mg tablet, Take 1 tablet (20 mg total) by mouth daily with evening meal., Disp: 30 tablet, Rfl: 4  ???  sertraline HCl (SERTRALINE ORAL), Take 150 mg by mouth daily. , Disp: , Rfl:   ???  triamcinolone (KENALOG) 0.5 % ointment, Apply topically Two (2) times a day. Apply for 7 days., Disp: 30 g, Rfl: 0    Allergies  Oxycodone and Mistletoe (viscum Jordan - from apple trees)    Family History   Problem Relation Age of Onset   ??? Diabetes Mother    ??? Hypertension Mother    ??? Cancer Mother    ??? Depression Mother    ??? Diabetes Maternal Grandmother    ??? Cancer Maternal Grandmother    ??? Hearing loss Paternal Grandmother    ??? Arthritis Cousin    ??? Asthma Cousin    ??? No Known Problems Father    ??? No Known Problems Sister    ??? No Known Problems Brother    ??? No Known Problems Maternal Aunt    ??? No Known Problems Maternal Uncle    ??? No Known Problems Paternal Aunt    ??? No Known Problems Paternal Uncle    ??? No Known Problems Maternal Grandfather    ??? No Known Problems Paternal Grandfather    ??? No Known Problems Other    ??? Blindness Neg Hx    ??? Melanoma Neg Hx    ??? Basal cell carcinoma Neg Hx    ??? Squamous cell carcinoma Neg Hx    ??? Anesthesia problems Neg Hx    ??? Broken bones Neg Hx    ??? Clotting disorder Neg Hx    ??? Collagen disease Neg Hx    ??? Dislocations Neg Hx    ??? Fibromyalgia Neg Hx    ??? Gout Neg Hx    ??? Hemophilia Neg Hx    ??? Osteoporosis Neg Hx    ??? Rheumatologic disease Neg Hx    ??? Scoliosis Neg Hx    ??? Severe sprains Neg Hx    ??? Sickle cell anemia Neg Hx    ??? Spinal Compression Fracture Neg Hx    ??? Glaucoma Neg Hx        Social History  Social History     Tobacco Use   ??? Smoking status: Never   ??? Smokeless tobacco: Never   Vaping Use   ??? Vaping Use: Never used   Substance Use Topics   ??? Alcohol use: Yes     Comment: Occasionally   2 x  month    ??? Drug use: No          Physical Exam     ED Triage Vitals [05/28/21 1258]   Enc Vitals Group      BP 139/71      Heart Rate 72      SpO2 Pulse       Resp 16      Temp 36.7 ??C (98 ??F)      Temp Source Oral      SpO2 100 %      Weight       Height       Head Circumference       Peak Flow       Pain Score       Pain Loc       Pain Edu?       Excl. in GC?  Constitutional: well-appearing, NAD  Eyes: Conjunctivae are normal.  ENT       Head: Normocephalic and atraumatic.       Nose: No congestion.       Mouth/Throat: Mucous membranes are moist.       Neck: No stridor.  Hematological/Lymphatic/Immunilogical: No cervical lymphadenopathy.  Cardiovascular: Normal rate, regular rhythm. Normal and symmetric distal pulses are present in all extremities.  Respiratory: Normal respiratory effort. Breath sounds are normal.  Gastrointestinal: Soft and nontender. There is no CVA tenderness.  Musculoskeletal: Normal range of motion in all extremities.       Right lower leg: No tenderness or edema.       Left lower leg: No tenderness or edema.  Neurologic: Normal speech and language. No gross focal neurologic deficits are appreciated.  Skin: Skin is warm, dry and intact. No rash noted.  Psychiatric: Mood and affect are normal. Speech and behavior are normal.       Radiology     CTV Head W WO Contrast    (Results Pending)          Clancy Gourd, MD  Resident  05/28/21 [phone]

## 2021-05-29 NOTE — Unmapped (Signed)
[facility] ED Provider Progress Note      ED FINAL IMPRESSION:     Final diagnoses:   Acute nonintractable headache, unspecified headache type (Primary)       ENCOUNTER SUMMARY & PLAN:     TIME OF CARE ASSUMED: May 28, 2021 1900    PREVIOUS PROVIDER: Dr. Ethel Rana SUMMARY: Briefly, Melody Padilla is a 26 y.o. female with pmh Evans syndrome, antiphospholipid syndrome on Xarelto (although has recently been missing doses due to forgetfulness), migraines, presents emergency department for evaluation of 1 week of headache.  See previous providers note for further historic and exam details.    At this point, CBC is unremarkable, pregnancy test negative, BMP is reassuring.  She has been given Compazine, Benadryl, Fioricet, LR, Tylenol, and magnesium for headache.    At time of signout, plan was to follow-up CTV of the brain.    ED COURSE:     Vitals:    05/28/21 1258 05/28/21 1452 05/28/21 1848 05/28/21 1851   BP: 139/71 118/72 123/71    Pulse: 72 72  72   Resp: 16 16  16    Temp: 36.7 ??C (98 ??F) 36.7 ??C (98 ??F)  36.7 ??C (98 ??F)   TempSrc: Oral Oral  Oral   SpO2: 100% 100%  100%        Personally reviewed CTV head, which is without acute intracranial findings or obvious filling defects in the venous system.  Radiology read confirms this finding.  Reassessed patient, headache significantly improved.  She declines further pain medications for symptoms here in the emergency department, and is requesting discharge home.  Discussed the importance of consistently taking your Xarelto at home.  Advised use of over-the-counter Tylenol at home for any recurrent symptoms of headache.  Return precautions discussed and outlined in discharge instructions.  Patient is agreeable to this plan and amenable to discharge.    Final care and disposition discussed with Dr. Maggie Schwalbe who is in agreement with the plan.    Konrad Penta, MD  [facility]

## 2021-06-10 NOTE — Unmapped (Signed)
Patient will call back after her appt. Scheduling follow up call for 2/24

## 2021-07-01 NOTE — Unmapped (Signed)
Patient is waiting for an appt with dr. follow up call scheduled for 3/24

## 2021-07-29 NOTE — Unmapped (Signed)
patient has enough medication on hand, rescheduling refill call for 4/21

## 2021-08-18 NOTE — Unmapped (Signed)
[facility] Clinical Assessment & Refill Coordination Note    Melody Padilla, [place]: Apr 27, 1996  Phone: [phone] (home)     All above HIPAA information was verified with patient.     Was a Nurse, learning disability used for this call? No    Specialty Medication(s):   Inflammatory Disorders: mycophenolate     Current Outpatient Medications   Medication Sig Dispense Refill   ??? acetaminophen (TYLENOL) 325 MG tablet Take 2 tablets (650 mg total) by mouth every six (6) hours as needed. 30 tablet 0   ??? albuterol HFA 90 mcg/actuation inhaler Inhale 2 puffs every six (6) hours as needed for wheezing or shortness of breath. 8 g 3   ??? famotidine (PEPCID) 20 MG tablet Take 20 mg by mouth Two (2) times a day.     ??? hydrOXYchloroQUINE (PLAQUENIL) 200 mg tablet Take 2 tablets (400 mg total) by mouth daily. 180 tablet 3   ??? mycophenolate (CELLCEPT) 500 mg tablet Take 2 tablets (1,000 mg total) by mouth Two (2) times a day. 360 tablet 0   ??? rivaroxaban (XARELTO) 20 mg tablet Take 1 tablet (20 mg total) by mouth daily with evening meal. 30 tablet 4   ??? sertraline HCl (SERTRALINE ORAL) Take 150 mg by mouth daily.      ??? triamcinolone (KENALOG) 0.5 % ointment Apply topically Two (2) times a day. Apply for 7 days. 30 g 0     Current Facility-Administered Medications   Medication Dose Route Frequency Provider Last Rate Last Admin   ??? levonorgestrel (MIRENA) 20 mcg/24 hr (5 years) IUD 1 kit  1 each Intrauterine Once Sydnee Levans, MD            Changes to medications: Sephira reports starting the following medications: migraine medication (unsure of name)    Allergies   Allergen Reactions   ??? Oxycodone Shortness Of Breath, Itching and Rash   ??? Mistletoe (Viscum Jordan - From [employer])      Allergic to All Types of tree's       Changes to allergies: No    SPECIALTY MEDICATION ADHERENCE     Mycophenolate 500 mg: 30+ days of medicine on hand.  Patient states that she restarted the medication about a month ago.  She was off medication for about 3 months due to personal issues.     Medication Adherence    Patient reported X missed doses in the last month: 3  Specialty Medication: Mycophenolate  Informant: patient          Specialty medication(s) dose(s) confirmed: Regimen is correct and unchanged.     Are there any concerns with adherence? Patient occassionally misses morning dose due to busyness in the morning with getting her child ready.  She has tired to set different reminders but still forgets.    Adherence counseling provided? Advised patient that she can take the medicaiton as soon as she remembers in the morning.  She does not have to take it exactly at 7am or 8am in the morning.    CLINICAL MANAGEMENT AND INTERVENTION      Clinical Benefit Assessment:    Do you feel the medicine is effective or helping your condition? Yes    Clinical Benefit counseling provided? Not needed    Adverse Effects Assessment:    Are you experiencing any side effects? Patient reports nausea.  Advised patient that she can take medication with food to try to alleviate nausea.  Are you experiencing difficulty administering your medicine? No    Quality of Life Assessment:    Quality of Life    Rheumatology  Oncology  Dermatology  Cystic Fibrosis          How many days over the past month did your SLE  keep you from your normal activities? For example, brushing your teeth or getting up in the morning. has had joint pain a couple of days with some pain relief with Tylenol    Have you discussed this with your provider? Not needed    Acute Infection Status:    Acute infections noted within Epic:  No active infections  Patient reported infection: Had a STI that was treated with medication    Therapy Appropriateness:    Is therapy appropriate and patient progressing towards therapeutic goals? Yes, therapy is appropriate and should be continued    DISEASE/MEDICATION-SPECIFIC INFORMATION      N/A    PATIENT SPECIFIC NEEDS     - Does the patient have any physical, cognitive, or cultural barriers? No    - Is the patient high risk? No    - Does the patient require a Care Management Plan? No     SOCIAL DETERMINANTS OF HEALTH     At the [facility], we have learned that life circumstances - like trouble affording food, housing, utilities, or transportation can affect the health of many of our patients.   That is why we wanted to ask: are you currently experiencing any life circumstances that are negatively impacting your health and/or quality of life? No    Social Determinants of Psychologist, prison and probation services Strain: Not on file   Internet Connectivity: Not on file   Food Insecurity: Not on file   Tobacco Use: Low Risk    ??? Smoking Tobacco Use: Never   ??? Smokeless Tobacco Use: Never   ??? Passive Exposure: Not on file   Housing/Utilities: Not on file   Alcohol Use: Not on file   Transportation Needs: Not on file   Substance Use: Not on file   Health Literacy: Not on file   Physical Activity: Not on file   Interpersonal Safety: Not on file   Stress: Not on file   Intimate Partner Violence: Not on file   Depression: Not on file   Social Connections: Not on file       Would you be willing to receive help with any of the needs that you have identified today? Not applicable       SHIPPING     Specialty Medication(s) to be Shipped:   Mycophenolate not needed at this time    Other medication(s) to be shipped: No additional medications requested for fill at this time     Changes to insurance: No    Delivery Scheduled: Patient declined refill at this time due to over a month of medicaiton on hand.     Medication will be delivered via N/A to the confirmed N/A address in [facility].    The patient will receive a drug information handout for each medication shipped and additional FDA Medication Guides as required.  Verified that patient has previously received a Conservation officer, historic buildings and a Surveyor, mining.    The patient or caregiver noted above participated in the development of this care plan and knows that they can request review of or adjustments to the care plan at any time.      All of  the patient's questions and concerns have been addressed.    Melody Padilla  [facility] Specialty Pharmacist

## 2021-09-06 ENCOUNTER — Ambulatory Visit: Admit: 2021-09-06 | Discharge: 2021-09-07 | Payer: MEDICARE

## 2021-09-06 DIAGNOSIS — D6941 Evans syndrome: Principal | ICD-10-CM

## 2021-09-06 DIAGNOSIS — M328 Other forms of systemic lupus erythematosus: Principal | ICD-10-CM

## 2021-09-06 DIAGNOSIS — M329 Systemic lupus erythematosus, unspecified: Principal | ICD-10-CM

## 2021-09-06 LAB — URINALYSIS WITH MICROSCOPY WITH CULTURE REFLEX
BILIRUBIN UA: NEGATIVE
BLOOD UA: NEGATIVE
GLUCOSE UA: NEGATIVE
KETONES UA: NEGATIVE
NITRITE UA: NEGATIVE
PH UA: 5.5 (ref 5.0–9.0)
PROTEIN UA: NEGATIVE
RBC UA: 3 /HPF (ref 0–3)
SPECIFIC GRAVITY UA: 1.025 (ref 1.005–1.030)
SQUAMOUS EPITHELIAL: 6 /HPF — ABNORMAL HIGH (ref 0–5)
UROBILINOGEN UA: 0.2
WBC UA: 5 /HPF — ABNORMAL HIGH (ref 0–3)

## 2021-09-06 LAB — CBC W/ AUTO DIFF
BASOPHILS ABSOLUTE COUNT: 0 10*9/L (ref 0.0–0.1)
BASOPHILS RELATIVE PERCENT: 0.8 %
EOSINOPHILS ABSOLUTE COUNT: 1 10*9/L — ABNORMAL HIGH (ref 0.0–0.5)
EOSINOPHILS RELATIVE PERCENT: 15.8 %
HEMATOCRIT: 40.4 % (ref 34.0–44.0)
HEMOGLOBIN: 14.4 g/dL (ref 11.3–14.9)
LYMPHOCYTES ABSOLUTE COUNT: 1 10*9/L — ABNORMAL LOW (ref 1.1–3.6)
LYMPHOCYTES RELATIVE PERCENT: 16.7 %
MEAN CORPUSCULAR HEMOGLOBIN CONC: 35.6 g/dL (ref 32.0–36.0)
MEAN CORPUSCULAR HEMOGLOBIN: 30.8 pg (ref 25.9–32.4)
MEAN CORPUSCULAR VOLUME: 86.4 fL (ref 77.6–95.7)
MEAN PLATELET VOLUME: 9.6 fL (ref 6.8–10.7)
MONOCYTES ABSOLUTE COUNT: 0.5 10*9/L (ref 0.3–0.8)
MONOCYTES RELATIVE PERCENT: 8.8 %
NEUTROPHILS ABSOLUTE COUNT: 3.5 10*9/L (ref 1.8–7.8)
NEUTROPHILS RELATIVE PERCENT: 57.9 %
NUCLEATED RED BLOOD CELLS: 0 /100{WBCs} (ref <=4)
PLATELET COUNT: 134 10*9/L — ABNORMAL LOW (ref 150–450)
RED BLOOD CELL COUNT: 4.68 10*12/L (ref 3.95–5.13)
RED CELL DISTRIBUTION WIDTH: 12.6 % (ref 12.2–15.2)
WBC ADJUSTED: 6.1 10*9/L (ref 3.6–11.2)

## 2021-09-06 LAB — CREATININE
CREATININE: 0.61 mg/dL
EGFR CKD-EPI (2021) FEMALE: >90 mL/min/{1.73_m2} (ref >=60)

## 2021-09-06 LAB — C3 COMPLEMENT: C3 COMPLEMENT: 115 mg/dL (ref 84–160)

## 2021-09-06 LAB — PROTEIN / CREATININE RATIO, URINE
CREATININE, URINE: 173.8 mg/dL
PROTEIN URINE: 16.3 mg/dL
PROTEIN/CREAT RATIO, URINE: 0.094

## 2021-09-06 LAB — AST: AST (SGOT): 25 U/L (ref <=34)

## 2021-09-06 LAB — BUN: BLOOD UREA NITROGEN: 8 mg/dL — ABNORMAL LOW (ref 9–23)

## 2021-09-06 LAB — ALBUMIN: ALBUMIN: 4.2 g/dL (ref 3.4–5.0)

## 2021-09-06 LAB — C4 COMPLEMENT: C4 COMPLEMENT: 15.8 mg/dL (ref 12.0–36.0)

## 2021-09-06 LAB — ALT: ALT (SGPT): 25 U/L (ref 10–49)

## 2021-09-06 MED ORDER — HYDROXYCHLOROQUINE 200 MG TABLET
ORAL_TABLET | Freq: Every day | ORAL | 3 refills | 90 days | Status: CP
Start: 2021-09-06 — End: 2022-09-06
  Filled 2021-11-14: qty 180, 90d supply, fill #0

## 2021-09-06 NOTE — Unmapped (Signed)
You were seen by [facility] today.      [facility]  [address]   [place], [place] [address]  Phone:  [phone]  Fax:  [phone]     Rheumatologist:  Dr. Ilsa Iha       Summary of Plan for 09/06/21    Diagnostic testing recommendations:  Labs today (in clinic or on the first floor of the [employer])    Therapeutic / Treatment recommendations:   We will continue your plaquenil 400 mg daily.  For now, it is okay to stay off the cellcept.  We will monitor closely for signs of lupus activity. Please continue with your rituximab infusions.     When you are out in the sun, you should be wearing SPF 50 or greater to protect your skin (and reduce risk of lupus flare of the skin / skin rash)     Referrals and follow-up recommendations:  Please follow-up with PCP and hematology  Please call [facility] ophthalmology to set up your yearly eye exam [phone])     When should I expect results?   Please note that it may take up to 21 days for results to return.  If you have not been notified by phone, mail or [facility] (if applicable) in 21 days, please contact our office at [phone] or via [facility] messaging [email or web address])     What do I do if I have questions or concerns after the visit?   If you have non-urgent questions, the best way to contact your provider is to send a MyChart message by visiting [email or web address].  You can also use MyChart to request refills and access test results.  Providers will do their best to respond within 2-3 business days, although it may take longer in some circumstances.  If you have immediate concerns, please contact our clinic by phone  at [phone].     Thank you for allowing [facility]ology to be involved in your care!

## 2021-09-06 NOTE — Unmapped (Signed)
[facility] visit    Assessment and Plan:    26 y.o. 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     Last seen 01/2021    She is currently prescribed HCQ 400 mg daily and MMF 1000 mg BID but is not taking regularly. 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  She is on Xarelto given her h/o APLA per hematology.     1. SLE - overall, stable;  given her stability (and difficulty with adhering to MMF) will trail off this medication; of note, it is unclear to me if she has been taking this medication regularly enough for it to have an impact on her disease and as such, will remove this from her medication list   - Check SLE monitoring labs today: CBC, CMP, dsDNA, C3, C4, UPA, UA  - Continue HCQ 400 mg daily, she last had eye exam 06/2020; she will call to make that appt   - Stop MMF for time being, if we note worsening of SLE, we can consider alternate steroid sparing medications like AZA or even LEF depending on organ involvement  - See below for rituximab 1000 mg IV x 1 q6 months  - continue voltaren gel for joints  - Recommended SPF 50 or greater for sun protection     2. 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 Chest pain, elevated troponin and hospitalization 11/2019; dx with type II MI; Resolved; Initial concern for myopericarditis but with normal TTE and cMRI; PET  11/2019; no recurrent sx; follow up with cardiology and PCP     HealthCare Maintenance:  - Immunizations:                            Immunization History   Administered Date(s) Administered    COVID-19 VACCINE,MRNA(MODERNA)(PF) 12/01/2019, 12/29/2019    DTaP 05/29/1996, 07/31/1996, 10/02/1996, 04/14/1997, 06/04/2000    HPV Quadrivalent (Gardasil) 09/23/2008, 11/25/2008, 03/31/2009    Haemophilis Influenza Type B Vaccine Hboc 05/29/1996, 07/31/1996, 10/02/1996, 04/14/1997    Hepatitis A Vaccine - Unspecified Formulation 06/04/2007    Hepatitis A Vaccine Pediatric / Adolescent 2 Dose IM 12/11/2007    Hepatitis B Vaccine, Unspecified Formulation Aug 01, 1995, 05/29/1996, 10/02/1996    INFLUENZA TIV (TRI) 21MO+ W/ PRESERV (IM) 03/31/2009, 06/15/2010, 05/25/2011, 03/27/2012    Influenza LAIV (Nasal-Tri) HISTORICAL 02/17/2008    Influenza Vaccine Quad (IIV4 PF) 60mo+ injectable 03/20/2014, 02/01/2017, 01/25/2018, 01/22/2019, 03/18/2020    Influenza Vaccine Quad (IIV4 W/PRESERV) 21MO+ 05/07/2013    Influenza Virus Vaccine, unspecified formulation 05/05/2013    MMR 04/14/1997, 06/04/2000    Meningococcal ACWY, Unspecified Formulation 06/04/2007, 10/03/2012    Meningococcal B Vaccine, OMV Adjuvanted(Bexsero) 01/10/2018    Meningococcal C Conjugate 10/03/2012    OPV 05/29/1996, 07/31/1996, 10/02/1996  PNEUMOCOCCAL POLYSACCHARIDE 23 10/03/2012    Pneumococcal, Unspecified Formulation 10/03/2012    Poliovirus,inactivated (IPV) 06/04/2000    SHINGRIX-ZOSTER VACCINE (HZV), RECOMBINANT,SUB-UNIT,ADJUVANTED IM 02/12/2019    TdaP 12/11/2007, 10/10/2013, 01/10/2018    Varicella 09/07/1998, 12/11/2007     I personally spent 35 minutes face-to-face and non-face-to-face in the care of this patient, which includes all pre, intra, and post visit time on the date of service.    RTC in 4-5 months or sooner PRN ________________________________________________    Reason for visit: Follow up of lupus and Evan's syndrome    HPI: [name] is a 26 y.o. 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  She is a former patient of Dr. Renford Padilla who transitioned into Dr. Chaya Padilla clinic in 12/2017.    Last seen 01/2021      She is currently prescribed HCQ 400 mg daily and MMF 1000 mg BID. 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  She was on warfarin given her h/o APLA but this was changed to Xarelto.     Interim History:    She stopped her HCQ and MMF for about 3 months.  She says she felt really depressed about taking medications and was tired over.     She says she was switched from warfarin to Xarelto to help her with adherence (notes that warfarin was hard for her due to dietary restrictions and what not).     She has restarted the HCQ and the MMF.  She is still struggling with taking her medications regularly as she finds it quite difficult.    She has noticed that she was having worsening rash, which is one of the reasons why she re-started her MMF and HCQ  She also says her joints worsened a bit.     Rheumatologic History:  Briefly, she developed Evan's syndrome in 06/2012, and was treated with corticosteroid, IVIG, vincristine, and rituximab. Rituximab (4 weekly doses in July/August 2014) was effective and blood counts remained normal for quite some time. She established with [facility] in 2015 at which time she was [redacted] weeks pregnant (delivered baby without complications). experiencing arthralgias without evidence of frank synovitis.  Her autoimmune work-up revealed + ANA (1:160), equivocal SSA (repeat negative).  She has had very low positive dsDNA and mild hypocomplementemia in the past as well.  She was started on plaquenil for treatment of her SLE in 2015. APS studies were positive.  Developed DVT in 09/2016 and has been on chronic anticoagulation ever since. She was admitted to [facility] hospital in 12/2017 with flare of her SLE and WAIHA.  She was treated with methylprednisolone 1 g x 3 doses (01/27/18 - 01/29/18) with subsequent prednisone taper and, Rituximab 375 mg/m^2 ( 01/27/18, 02/11/18, 02/18/18 and 02/25/18).  Her cell counts  responded well and prednisone tapered.  She was started on AZA in 2020 given concern for ongoing SLE activity and arthralgias, ultimately increased to 200 mg daily in 12/2018.  In 01/2019 and 03/2019, suffered worsening Evan's and was treated with steroids, IVIG and rituximab given refractory ITP.  She was hospitalized again in 07/2019 for her Evan Syndrome - primarily manifested as ITP.  She was treated with dexamethasone, IVIG and rituximab (4/16 and 08/28/2019). After  consultation with rheumatology IP team,  AZA was switched to MMF given her refractory disease.  Per heme, recommend maintenance with ritux 1000 mg x 2 q69months    Treatment Hx:    HCQ 200 mg BID (or 400 mg daily) - started 2015  AZA 100mg  --> 200 mg   Rituximab   - 375 mg/m2 ( 10/28/2012;  11/06/2012; 11/13/2012; 11/20/2012)  - 375 mg/m2 (02/11/2018; 02/18/2018; 02/25/2018)  - 1000 mg  x 2 (02/11/19, 02/27/19; 08/15/19, 08/28/19  - 1000 mg x 1 (03/01/2020, 08/30/2020, 03/2021)      ROS: As per HPI, otherwise 10 system review negative    Record review:  I have reviewed the patient's allergies, medications, pertinent past medical, surgical, social and family history and have updated in Epic where appropriate.     Past Medical History:   Diagnosis Date    Acute deep vein thrombosis (DVT) of femoral vein of right lower extremity (CMS-HCC) 10/12/2016    Acute pulmonary embolism (CMS-HCC) 10/12/2016    Anxiety     Clotting disorder (CMS-HCC)     CTS (carpal tunnel syndrome)     Depression complicating pregnancy, antepartum 10/10/2013    Eczema     Evan's syndrome (CMS-HCC)     Evan's syndrome (CMS-HCC)     Fractures     left wrist    Joint pain     Lupus (CMS-HCC)     Migraine     Obesity     Psoriasis     Steatosis of liver 07/01/2014    Thrombocytopenia (CMS-HCC)      Current Outpatient Medications on File Prior to Visit   Medication Sig Dispense Refill    acetaminophen (TYLENOL) 325 MG tablet Take 2 tablets (650 mg total) by mouth every six (6) hours as needed. 30 tablet 0    famotidine (PEPCID) 20 MG tablet Take 1 tablet (20 mg total) by mouth Two (2) times a day.      hydrOXYchloroQUINE (PLAQUENIL) 200 mg tablet Take 2 tablets (400 mg total) by mouth daily. 180 tablet 3    mycophenolate (CELLCEPT) 500 mg tablet Take 2 tablets (1,000 mg total) by mouth Two (2) times a day. 360 tablet 0    rivaroxaban (XARELTO) 20 mg tablet Take 1 tablet (20 mg total) by mouth daily with evening meal. 30 tablet 4    albuterol HFA 90 mcg/actuation inhaler Inhale 2 puffs every six (6) hours as needed for wheezing or shortness of breath. 8 g 3    sertraline HCl (SERTRALINE ORAL) Take 150 mg by mouth daily.  (Patient not taking: Reported on 09/06/2021)       Current Facility-Administered Medications on File Prior to Visit   Medication Dose Route Frequency Provider Last Rate Last Admin    levonorgestrel (MIRENA) 20 mcg/24 hr (5 years) IUD 1 kit  1 each Intrauterine Once Sydnee Levans, MD         Allergies   Allergen Reactions    Oxycodone Shortness Of Breath, Itching and Rash    [place] (Viscum [place] - From [place] Trees)      Allergic to All Types of tree's     Past Surgical History:   Procedure Laterality Date    CHOLECYSTECTOMY      PERIPHERALLY INSERTED CENTRAL CATHETER INSERTION      PR LAP,CHOLECYSTECTOMY N/A 07/09/2014    Procedure: LAPAROSCOPY, SURGICAL; CHOLECYSTECTOMY;  Surgeon: Bo Mcclintock, MD;  Location: MAIN OR [place];  Service: Trauma    PR UPPER GI  ENDOSCOPY,DIAGNOSIS N/A 07/07/2014    Procedure: UGI ENDO, INCLUDE ESOPHAGUS, STOMACH, & DUODENUM &/OR JEJUNUM; DX W/WO COLLECTION SPECIMN, BY BRUSH OR WASH;  Surgeon: Donneta Romberg, MD;  [facility];  Service: Gastroenterology    PR WEDGE BIOPSY OF LIVER Right 07/09/2014    Procedure: BIOPSY OF LIVER;  Surgeon: Bo Mcclintock, MD;  Location: MAIN OR [place];  Service: Trauma    SKIN BIOPSY      TONGUE SURGERY Midline 2006    2nd grade    WISDOM TOOTH EXTRACTION       Family History   Problem Relation Age of Onset    Diabetes Mother     Hypertension Mother     Cancer Mother     Depression Mother     Diabetes Maternal Grandmother     Cancer Maternal Grandmother     Hearing loss Paternal Grandmother     Arthritis Cousin     Asthma Cousin     No Known Problems Father     No Known Problems Sister     No Known Problems Brother     No Known Problems Maternal Aunt     No Known Problems Maternal Uncle     No Known Problems Paternal Aunt     No Known Problems Paternal Uncle     No Known Problems Maternal Grandfather     No Known Problems Paternal Grandfather     No Known Problems Other     Blindness Neg Hx     Melanoma Neg Hx     Basal cell carcinoma Neg Hx     Squamous cell carcinoma Neg Hx     Anesthesia problems Neg Hx     Broken bones Neg Hx     Clotting disorder Neg Hx     Collagen disease Neg Hx     Dislocations Neg Hx     Fibromyalgia Neg Hx     Gout Neg Hx     Hemophilia Neg Hx     Osteoporosis Neg Hx     Rheumatologic disease Neg Hx     Scoliosis Neg Hx     Severe sprains Neg Hx     Sickle cell anemia Neg Hx     Spinal Compression Fracture Neg Hx     Glaucoma Neg Hx      Social History     Tobacco Use    Smoking status: Never    Smokeless tobacco: Never   Vaping Use    Vaping Use: Never used   Substance Use Topics    Alcohol use: Yes     Comment: Occasionally   2 x  month     Drug use: No       Physical Examination:    Vitals:    09/06/21 0956   BP: 113/67   Pulse: 65   Temp: 36.2 ??C (97.2 ??F) Gen: NAD, alert and interactive  Eyes: EOMI, Anicteric sclera, non injected conjunctiva  ENT: No oral ulcers, moist oral mucosa, no parotid enlargement  Respiratory: CTAB, normal WOB, symmetric chest expansion  CVS: RRR, no murmurs  Skin: No visible skin rash   Neuro: AAO X 3, strength symmetrical bilaterally, ambulating without assistance  MSK: Shoulders, elbows, wrists without tenderness or swelling, Small joints of hands with out swelling FROM, ankles w/o swelling and non tender  Psych: Mood and affect appropriate and congruent      Labs/Imaging/Other:    Lab Results   Component Value Date  WBC 4.8 05/28/2021    HGB 14.1 05/28/2021    HCT 40.4 05/28/2021    PLT 185 05/28/2021       Lab Results   Component Value Date    NA 142 05/28/2021    K 3.9 05/28/2021    CL 109 (H) 05/28/2021    CO2 22.0 05/28/2021    BUN 7 (L) 05/28/2021    CREATININE 0.61 05/28/2021    GLU 83 05/28/2021    CALCIUM 9.2 05/28/2021    MG 2.0 12/26/2019    PHOS 3.7 01/28/2018       Lab Results   Component Value Date    BILITOT 1.3 (H) 05/09/2021    BILIDIR <0.10 08/14/2019    PROT 7.3 05/09/2021    ALBUMIN 4.1 05/09/2021    ALT 58 (H) 05/09/2021    AST 38 (H) 05/09/2021    ALKPHOS 56 05/09/2021    GGT 52 (H) 10/02/2014       Lab Results   Component Value Date    INR 4.71 02/18/2021    APTT 98.8 (H) 02/18/2021       X ray R knee: No acute osseous abnormality, right knee

## 2021-09-13 LAB — ANTI-DNA ANTIBODY, DOUBLE-STRANDED
DSDNA AB TITER: 1:320 {titer}
DSDNA ANTIBODY: POSITIVE — AB

## 2021-09-15 ENCOUNTER — Ambulatory Visit: Admit: 2021-09-15 | Discharge: 2021-09-16 | Payer: MEDICARE

## 2021-09-15 MED ADMIN — methylPREDNISolone sodium succinate (PF) (SOLU-medrol) injection 125 mg: 125 mg | INTRAVENOUS | @ 14:00:00 | Stop: 2021-09-15

## 2021-09-15 MED ADMIN — sodium chloride 0.9% (NS) bolus 1,000 mL: 1000 mL | INTRAVENOUS | @ 15:00:00 | Stop: 2021-09-15

## 2021-09-15 MED ADMIN — diphenhydrAMINE (BENADRYL) injection 50 mg: 50 mg | INTRAVENOUS | @ 13:00:00 | Stop: 2021-09-15

## 2021-09-15 MED ADMIN — riTUXimab-abbs (TRUXIMA) 1,000 mg in sodium chloride (NS) 0.9 % 640 mL IVPB: 1000 mg | INTRAVENOUS | @ 14:00:00 | Stop: 2021-09-15

## 2021-09-15 MED ADMIN — acetaminophen (TYLENOL) tablet 650 mg: 650 mg | ORAL | @ 13:00:00 | Stop: 2021-09-15

## 2021-09-15 MED ADMIN — diphenhydrAMINE (BENADRYL) injection 25 mg: 25 mg | INTRAVENOUS | @ 15:00:00 | Stop: 2021-09-15

## 2021-09-15 NOTE — Unmapped (Signed)
Pt presents for Rituxan infusion.  Pt denies any recent infection, VSS.  IV placed, labs drawn, premeds administered.  Pt aware of potential reaction/side effects, call bell within reach.    Per Dr. Janie Morning, okay to infuse Rituxan based upon lab results from 09/06/2021:    WBC: 6.1  Platelets: 134    0936 Rituxan 1000mg  started at the following rates:    50mg /hr for 30 min  100mg /hr for 30 min  150mg /hr for 30 min  200mg /hr for 30 min  250mg /hr for 30 min  300mg /hr for 30 min  350mg /hr for 30 min  400mg /hr until complete      1111 Patient rang call bell and reported that she felt itchiness in the back of my throat like before with my last infusion. Infusion stopped, VSS. Patient sounds congested and reported that her nose is stuffy.     1113 0.9% N.S. started infusing per emergency protocol. Patient still reporting feeling stuffy and feeling Itchiness is not resolving.     1115 Benadryl 25mg  IV given per emergency protocol.     1121 Patient states It's going away a little bit.    1128 Patient states It went away and my stuffiness is gone.    1147 Rituxan infusion restarted. VSS.     1427 Infusion complete. Pt tolerated without complication, VSS.  IV flushed per policy. IV d/c'd, gauze and coban applied.  Pt left clinic in no acute distress.

## 2021-09-20 NOTE — Unmapped (Signed)
Specialty Medication(s): mycophenolate    Ms.Afton Lavalle has been dis-enrolled from the [facility] specialty pharmacy services due to medication discontinuation resulting from progression of disease.    Additional information provided to the patient: see note from 09/06/21    Laurene Footman Octivia Canion  [facility] Specialty Pharmacist

## 2021-10-20 ENCOUNTER — Ambulatory Visit: Admit: 2021-10-20 | Discharge: 2021-10-21 | Payer: MEDICARE | Attending: Internal Medicine | Primary: Internal Medicine

## 2021-10-20 DIAGNOSIS — I2782 Chronic pulmonary embolism: Principal | ICD-10-CM

## 2021-10-20 DIAGNOSIS — K219 Gastro-esophageal reflux disease without esophagitis: Principal | ICD-10-CM

## 2021-10-20 DIAGNOSIS — D6861 Antiphospholipid syndrome: Principal | ICD-10-CM

## 2021-10-20 DIAGNOSIS — D696 Thrombocytopenia, unspecified: Principal | ICD-10-CM

## 2021-10-20 DIAGNOSIS — D6941 Evans syndrome: Principal | ICD-10-CM

## 2021-10-20 MED ORDER — FAMOTIDINE 20 MG TABLET
ORAL_TABLET | Freq: Every day | ORAL | 3 refills | 30 days | Status: CP
Start: 2021-10-20 — End: 2021-11-19

## 2021-10-20 MED ORDER — ALBUTEROL SULFATE HFA 90 MCG/ACTUATION AEROSOL INHALER
Freq: Four times a day (QID) | RESPIRATORY_TRACT | 3 refills | 0 days | Status: CP | PRN
Start: 2021-10-20 — End: 2022-10-20

## 2021-11-20 ENCOUNTER — Emergency Department: Admit: 2021-11-20 | Discharge: 2021-11-20 | Disposition: A | Discharge status: Home / Self Care | Payer: MEDICARE

## 2021-11-20 ENCOUNTER — Ambulatory Visit: Admit: 2021-11-20 | Discharge: 2021-11-20 | Disposition: A | Discharge status: Home / Self Care | Payer: MEDICARE

## 2021-11-20 DIAGNOSIS — R1012 Left upper quadrant pain: Principal | ICD-10-CM

## 2021-11-21 ENCOUNTER — Ambulatory Visit: Admit: 2021-11-21 | Discharge: 2021-11-21 | Disposition: A | Discharge status: Home / Self Care | Payer: MEDICARE

## 2021-11-21 DIAGNOSIS — I2699 Other pulmonary embolism without acute cor pulmonale: Principal | ICD-10-CM

## 2021-11-21 MED ORDER — ENOXAPARIN 300 MG/3 ML SUBCUTANEOUS SOLUTION
Freq: Two times a day (BID) | SUBCUTANEOUS | 0 refills | 7 days | Status: CP
Start: 2021-11-21 — End: 2021-11-28

## 2021-11-21 MED ORDER — VITAMIN K2 100 MCG CAPSULE
ORAL_CAPSULE | Freq: Every day | ORAL | 0 refills | 7 days | Status: CP
Start: 2021-11-21 — End: 2021-11-28

## 2021-11-30 ENCOUNTER — Ambulatory Visit: Payer: Medicaid Other

## 2021-12-08 ENCOUNTER — Ambulatory Visit: Admit: 2021-12-08 | Discharge: 2021-12-09 | Payer: MEDICARE | Attending: Internal Medicine | Primary: Internal Medicine

## 2021-12-08 DIAGNOSIS — I8290 Acute embolism and thrombosis of unspecified vein: Principal | ICD-10-CM

## 2021-12-09 ENCOUNTER — Ambulatory Visit: Admit: 2021-12-09 | Discharge: 2021-12-10 | Payer: MEDICARE

## 2022-01-12 ENCOUNTER — Ambulatory Visit: Admit: 2022-01-12 | Discharge: 2022-01-13 | Payer: MEDICARE

## 2022-01-12 DIAGNOSIS — Z79899 Other long term (current) drug therapy: Principal | ICD-10-CM

## 2022-01-12 DIAGNOSIS — Z0100 Encounter for examination of eyes and vision without abnormal findings: Principal | ICD-10-CM

## 2022-01-12 DIAGNOSIS — H5319 Other subjective visual disturbances: Principal | ICD-10-CM

## 2022-01-12 DIAGNOSIS — M3219 Other organ or system involvement in systemic lupus erythematosus: Principal | ICD-10-CM

## 2022-01-12 DIAGNOSIS — D6861 Antiphospholipid syndrome: Principal | ICD-10-CM

## 2022-01-12 DIAGNOSIS — H43399 Other vitreous opacities, unspecified eye: Principal | ICD-10-CM

## 2022-01-12 DIAGNOSIS — D6941 Evans syndrome: Principal | ICD-10-CM

## 2022-01-12 DIAGNOSIS — H04123 Dry eye syndrome of bilateral lacrimal glands: Principal | ICD-10-CM

## 2022-02-14 ENCOUNTER — Ambulatory Visit: Admit: 2022-02-14 | Discharge: 2022-02-14 | Payer: MEDICARE

## 2022-02-14 DIAGNOSIS — M329 Systemic lupus erythematosus, unspecified: Principal | ICD-10-CM

## 2022-02-14 DIAGNOSIS — A64 Unspecified sexually transmitted disease: Principal | ICD-10-CM

## 2022-02-14 DIAGNOSIS — Z7901 Long term (current) use of anticoagulants: Principal | ICD-10-CM

## 2022-02-16 ENCOUNTER — Encounter: Payer: Self-pay | Admitting: Psychiatry

## 2022-02-16 ENCOUNTER — Encounter: Payer: Self-pay | Admitting: Psychology

## 2022-02-16 ENCOUNTER — Ambulatory Visit (INDEPENDENT_AMBULATORY_CARE_PROVIDER_SITE_OTHER): Payer: Medicare Other | Admitting: Psychiatry

## 2022-02-16 VITALS — BP 109/69 | HR 58 | Temp 98.2°F | Ht 66.0 in | Wt 239.0 lb

## 2022-02-16 DIAGNOSIS — G4701 Insomnia due to medical condition: Secondary | ICD-10-CM | POA: Diagnosis not present

## 2022-02-16 DIAGNOSIS — R41840 Attention and concentration deficit: Secondary | ICD-10-CM | POA: Diagnosis not present

## 2022-02-16 DIAGNOSIS — F419 Anxiety disorder, unspecified: Secondary | ICD-10-CM | POA: Diagnosis not present

## 2022-02-16 DIAGNOSIS — F331 Major depressive disorder, recurrent, moderate: Secondary | ICD-10-CM

## 2022-02-16 MED ORDER — SERTRALINE HCL 100 MG PO TABS: 100.0000 mg | ORAL_TABLET | Freq: Every day | ORAL | 0 refills | Status: AC

## 2022-02-16 MED ORDER — HYDROXYZINE HCL 10 MG PO TABS: 10.0000 mg | ORAL_TABLET | Freq: Every day | ORAL | 0 refills | Status: AC | PRN

## 2022-02-16 NOTE — Progress Notes (Signed)
Psychiatric Initial Adult Assessment   Patient Identification: Jacqueline Black MRN:  [id number] Date of Evaluation:  02/16/2022 Referral Source: Ina Homes MD Chief Complaint:   Chief Complaint  Patient presents with   Establish Care   Anxiety   Depression   Insomnia   Attention problem   Visit Diagnosis:    ICD-10-CM   1. MDD (major depressive disorder), recurrent episode, moderate (HCC)  F33.1 sertraline (ZOLOFT) 100 MG tablet    2. Anxiety disorder, unspecified type  F41.9 sertraline (ZOLOFT) 100 MG tablet    hydrOXYzine (ATARAX) 10 MG tablet    TSH    3. Insomnia due to medical condition  G47.01 TSH   OSA , not compliant with CPAP, mood    4. Attention and concentration deficit  R41.840 TSH      History of Present Illness:  Jacqueline Black is a 26 year old Hispanic female, single, lives in [place], with her parents, has a history of SLE, Evans syndrome, history of DVT, pulmonary embolism, currently on warfarin, history of sleep apnea noncompliant on CPAP, depression, anxiety was evaluated in office today, presented to establish care.  Patient reports she has been struggling with depression and anxiety since the past couple of years.  Patient reports she currently struggles with sadness, low motivation, hopelessness, recurrent thoughts of death, sleep problems, reduced appetite, concentration problems, currently on Zoloft 50 mg restarted 2 months ago.  May have helped to some extent.  Patient also reports excessive worrying, nervous, anxious, feeling on edge, trouble relaxing, easily getting annoyed, irritable and fearful that something bad could happen, as well as panic attacks, going on since the past several years, continues to have the symptoms irrespective of being on Zoloft.  Patient reports she currently does not have a lot of panic attacks however usually these panic attacks are triggered by stressors, she has breathing difficulty and chest tightness when she  has them and it can last for 30 minutes or so.  Usually she is able to cope by relaxation techniques and focusing on her breath.  Tried hydroxyzine 25 mg in the past however that made her drowsy.  Patient does report sleep problems.  Going on since the past several years.  Reports she has difficulty falling asleep as well as maintaining sleep.  Reports she was diagnosed with sleep apnea in the past, reports she was prescribed CPAP however currently she is noncompliant.  Did try trazodone in the past which made her groggy the next day.  Hence not taking it.  Patient does struggle with attention and focus deficit, reports she has trouble staying on task, easy distractibility, keeping up with projects, struggles with organization skills, is restless often.  Reports she did not have a lot of trouble when she was in school however her problems may have started around the age of 72 or so.  Currently she struggles with that a lot on a regular basis.  Reports since there is ADHD in her family, her son was also recently diagnosed with like to get testing for the same.  Patient does report chronic thoughts of not wanting to be here since she feels she is a burden on her family.  Due to her chronic illness of systemic lupus erythematosus, Evan syndrome, reports she is unable to work.  Currently on disability.  However reports she lives for her son who is a positive factor in her life.  Currently denies any suicidality or intent.  Denies any suicide attempts.  Patient denies any auditory  hallucinations or visual hallucinations.  Patient denies any other concerns today.   Associated Signs/Symptoms: Depression Symptoms:  depressed mood, anhedonia, insomnia, fatigue, feelings of worthlessness/guilt, difficulty concentrating, hopelessness, recurrent thoughts of death, decreased appetite, (Hypo) Manic Symptoms:   Denies Anxiety Symptoms:  Excessive Worry, Panic Symptoms, Psychotic Symptoms:   Denies PTSD  Symptoms: Negative  Past Psychiatric History: Patient used to be under the care of a therapist in [place], does not remember the full name or the company name.  Reports she was in therapy for at least 1-1/2 years-last visit was in June 2021.  Patient reports she was diagnosed with depression and anxiety in the past.  She has been on and off on Zoloft for the past several years.  Recently restarted Zoloft by primary care provider 2 months ago. Denies suicide attempts. Does report self-injurious behaviors when she feels overly stressed out she pokes herself with something sharp like a pen to relieve her emotional pain.  Previous Psychotropic Medications: Yes trazodone-made her groggy, Zoloft, hydroxyzine 25 mg-made her sleepy.  Substance Abuse History in the last 12 months:  No.  Consequences of Substance Abuse: Negative  Past Medical History:  Past Medical History:  Diagnosis Date   Anxiety    Depression    Lupus (HCC)    History reviewed. No pertinent surgical history.  Family Psychiatric History: As noted below.  Family History:  Family History  Problem Relation Age of Onset   Depression Mother     Social History:   Social History   Socioeconomic History   Marital status: Single    Spouse name: Not on file   Number of children: 1   Years of education: Not on file   Highest education level: Some college, no degree  Occupational History   Not on file  Tobacco Use   Smoking status: Never   Smokeless tobacco: Never  Substance and Sexual Activity   Alcohol use: Yes    Alcohol/week: 2.0 standard drinks of alcohol    Types: 1 Glasses of wine, 1 Shots of liquor per week    Comment: occasional   Drug use: Not Currently   Sexual activity: Yes    Birth control/protection: I.U.D.  Other Topics Concern   Not on file  Social History Narrative   Not on file   Social Determinants of Health   Financial Resource Strain: Not on file  Food Insecurity: Not on file   Transportation Needs: Not on file  Physical Activity: Not on file  Stress: Not on file  Social Connections: Not on file    Additional Social History: Patient was born and raised in [place] by both her parents.  She has 2 siblings.  She graduated high school, some college.  Patient has been in couple of relationships in the past, currently single.  She had her son when she was 84 years old, her son is currently 73 years old. The father is not involved in his care.  Patient lives in [place] with her parents.  She is currently on Social Security disability for her chronic medical problems including SLE.  Denies any history of trauma.  Denies any legal problems.    Allergies:   Allergies  Allergen Reactions   Oxycodone Rash, Itching and Shortness Of Breath   Other     Allergic to All Types of tree's    Metabolic Disorder Labs: No results found for: "HGBA1C", "MPG" No results found for: "PROLACTIN" No results found for: "CHOL", "TRIG", "HDL", "CHOLHDL", "VLDL", "LDLCALC"  No results found for: "TSH"  Therapeutic Level Labs: No results found for: "LITHIUM" No results found for: "CBMZ" No results found for: "VALPROATE"  Current Medications: Current Outpatient Medications  Medication Sig Dispense Refill   acetaminophen (TYLENOL) 325 MG tablet Take by mouth.     albuterol (VENTOLIN HFA) 108 (90 Base) MCG/ACT inhaler Inhale into the lungs.     famotidine (PEPCID) 20 MG tablet Take by mouth.     hydroxychloroquine (PLAQUENIL) 200 MG tablet Take by mouth.     hydrOXYzine (ATARAX) 10 MG tablet Take 1 tablet (10 mg total) by mouth daily as needed for anxiety. 30 tablet 0   levonorgestrel (MIRENA) 20 MCG/DAY IUD by Intrauterine route.     Semaglutide,0.25 or 0.5MG /DOS, (OZEMPIC, 0.25 OR 0.5 MG/DOSE,) 2 MG/3ML SOPN Inject into the skin.     sertraline (ZOLOFT) 100 MG tablet Take 1 tablet (100 mg total) by mouth daily with breakfast. 90 tablet 0   warfarin (COUMADIN) 1 MG tablet Patient  agreed     warfarin (COUMADIN) 6 MG tablet Take 6 mg by mouth daily.     No current facility-administered medications for this visit.    Musculoskeletal: Strength & Muscle Tone: within normal limits Gait & Station: normal Patient leans: N/A  Psychiatric Specialty Exam: Review of Systems  Constitutional:  Positive for fatigue.  Neurological:  Positive for headaches.  Psychiatric/Behavioral:  Positive for decreased concentration, dysphoric mood and sleep disturbance. The patient is nervous/anxious.   All other systems reviewed and are negative.   Blood pressure 109/69, pulse (!) 58, temperature 98.2 F (36.8 C), temperature source Oral, height 5\' 6"  (1.676 m), weight 239 lb (108.4 kg).Body mass index is 38.58 kg/m.  General Appearance: Casual  Eye Contact:  Fair  Speech:  Clear and Coherent  Volume:  Normal  Mood:  Anxious and Depressed  Affect:  Congruent  Thought Process:  Goal Directed and Descriptions of Associations: Intact  Orientation:  Full (Time, Place, and Person)  Thought Content:  Logical  Suicidal Thoughts:  No  Homicidal Thoughts:  No  Memory:  Immediate;   Fair Recent;   Fair Remote;   Fair  Judgement:  Fair  Insight:  Fair  Psychomotor Activity:  Normal  Concentration:  Concentration: Fair and Attention Span: Fair  Recall:  [place]: Fair  Akathisia:  No  Handed:  Right  AIMS (if indicated):  not done  Assets:  Communication Skills Desire for Improvement Housing Social Support Transportation  ADL's:  Intact  Cognition: WNL  Sleep:  Poor   Screenings: GAD-7    Flowsheet Row Office Visit from 02/16/2022 in [facility]  Total GAD-7 Score 11      PHQ2-9    Phenix Visit from 02/16/2022 in [facility]  PHQ-2 Total Score 2  PHQ-9 Total Score 11      [facility] Visit from 02/16/2022 in [facility]       Assessment and Plan: Timothea Bodenheimer is a 26 year old Hispanic female,, single, lives in [place] with her parents, has a history of anxiety, depression, attention and focus deficit, also to sleep apnea noncompliant on CPAP, chronic medical problems like SLE, Evans syndrome, on warfarin treatment, was evaluated in office today, presented to establish care.  Patient is currently struggling with depression, anxiety, sleep problems, will benefit from the following plan. The patient demonstrates the following risk factors for  suicide: Chronic risk factors for suicide include: psychiatric disorder of depression, anxiety and previous self-harm yes . Acute risk factors for suicide include: unemployment and although she has SSD, she worries about not being productive  . Protective factors for this patient include: positive social support and responsibility to others (children, family). Considering these factors, the overall suicide risk at this point appears to be low. Patient is appropriate for outpatient follow up.   Plan  MDD-unstable Increase Zoloft to 100 mg p.o. daily. Referral for CBT, communicated with front desk staff to put this patient on a wait list to be scheduled with our new therapist who is joining the practice.  Anxiety disorder unspecified-unstable Increase Zoloft to 100 milligram p.o. daily Start hydroxyzine 10 mg p.o. daily as needed for severe anxiety attacks only. Referral for CBT  Insomnia-unstable Patient has sleep apnea, noncompliant on CPAP-provided education.  Encouraged compliance Advised to take Zoloft in the morning since that also likely affecting sleep. Discussed sleep hygiene techniques  Attention and concentration deficit-unstable Refer for neuropsychological testing for diagnostic clarification. Referral to Dr. Kieth Brightly.  Reviewed notes per rheumatologist-Dr. Renella Cunas 02/14/2022-patient with SLE, currently on  Plaquenil-dose which recently reduced due to concerns of side effects.  Will order labs-TSH-patient to sign a release so we can request labs if already completed.  Otherwise she can go to [facility] and get this done.  Reviewed and discussed most recent labs in EHR dated 02/14/2022-CBC with differential-within normal limits, AST/ALT-within normal limits, creatinine slightly elevated at 0.92.  Follow-up in clinic in 8 weeks or sooner if needed.     This note was generated in part or whole with voice recognition software. Voice recognition is usually quite accurate but there are transcription errors that can and very often do occur. I apologize for any typographical errors that were not detected and corrected.    Jomarie Longs, MD 10/19/202312:56 PM

## 2022-02-16 NOTE — Patient Instructions (Signed)

## 2022-03-17 DIAGNOSIS — K219 Gastro-esophageal reflux disease without esophagitis: Principal | ICD-10-CM

## 2022-03-17 MED ORDER — FAMOTIDINE 20 MG TABLET
ORAL_TABLET | Freq: Every morning | ORAL | 0 refills | 0 days
Start: 2022-03-17 — End: ?

## 2022-03-20 MED ORDER — FAMOTIDINE 20 MG TABLET
ORAL_TABLET | Freq: Every morning | ORAL | 0 refills | 90 days | Status: CP
Start: 2022-03-20 — End: ?

## 2022-03-28 ENCOUNTER — Ambulatory Visit: Admit: 2022-03-28 | Discharge: 2022-03-29 | Payer: MEDICARE

## 2022-03-29 ENCOUNTER — Ambulatory Visit: Payer: No Typology Code available for payment source | Admitting: Licensed Clinical Social Worker

## 2022-03-31 ENCOUNTER — Ambulatory Visit: Admit: 2022-03-31 | Discharge: 2022-04-01 | Payer: MEDICARE | Attending: Internal Medicine | Primary: Internal Medicine

## 2022-03-31 DIAGNOSIS — D590 Drug-induced autoimmune hemolytic anemia: Principal | ICD-10-CM

## 2022-03-31 DIAGNOSIS — D5911 Warm autoimmune hemolytic anemia (CMS-HCC): Principal | ICD-10-CM

## 2022-03-31 DIAGNOSIS — D6861 Antiphospholipid syndrome: Principal | ICD-10-CM

## 2022-03-31 DIAGNOSIS — M25562 Pain in left knee: Principal | ICD-10-CM

## 2022-03-31 DIAGNOSIS — G8929 Other chronic pain: Principal | ICD-10-CM

## 2022-03-31 DIAGNOSIS — D6941 Evans syndrome: Principal | ICD-10-CM

## 2022-04-19 ENCOUNTER — Ambulatory Visit: Payer: Medicaid Other | Admitting: Psychiatry

## 2022-05-10 ENCOUNTER — Ambulatory Visit: Admit: 2022-05-10 | Discharge: 2022-05-10 | Payer: MEDICARE

## 2022-05-11 ENCOUNTER — Ambulatory Visit: Admit: 2022-05-11 | Discharge: 2022-05-12 | Payer: MEDICARE

## 2022-05-11 DIAGNOSIS — I8290 Acute embolism and thrombosis of unspecified vein: Principal | ICD-10-CM

## 2022-06-12 ENCOUNTER — Ambulatory Visit: Admit: 2022-06-12 | Discharge: 2022-06-12 | Disposition: A | Discharge status: Home / Self Care | Payer: MEDICARE

## 2022-06-12 DIAGNOSIS — N760 Acute vaginitis: Principal | ICD-10-CM

## 2022-06-12 DIAGNOSIS — B9689 Other specified bacterial agents as the cause of diseases classified elsewhere: Principal | ICD-10-CM

## 2022-06-12 DIAGNOSIS — R109 Unspecified abdominal pain: Principal | ICD-10-CM

## 2022-06-12 MED ORDER — METRONIDAZOLE 0.75 % (37.5 MG/5 GRAM) VAGINAL GEL
Freq: Two times a day (BID) | VAGINAL | 0 refills | 7 days | Status: CP
Start: 2022-06-12 — End: 2022-06-19

## 2022-06-13 MED FILL — HYDROXYCHLOROQUINE 200 MG TABLET: ORAL | 90 days supply | Qty: 180 | Fill #1

## 2022-06-16 ENCOUNTER — Ambulatory Visit: Admit: 2022-06-16 | Discharge: 2022-06-16 | Disposition: A | Discharge status: Home / Self Care | Payer: MEDICARE

## 2022-06-16 ENCOUNTER — Emergency Department: Admit: 2022-06-16 | Discharge: 2022-06-16 | Disposition: A | Discharge status: Home / Self Care | Payer: MEDICARE

## 2022-06-16 DIAGNOSIS — D6861 Antiphospholipid syndrome: Principal | ICD-10-CM

## 2022-06-16 DIAGNOSIS — M79604 Pain in right leg: Principal | ICD-10-CM

## 2022-06-16 MED ORDER — WARFARIN 5 MG TABLET
ORAL_TABLET | Freq: Every day | ORAL | 0 refills | 30 days | Status: CP
Start: 2022-06-16 — End: 2022-07-16

## 2022-06-16 MED ORDER — PHYTONADIONE (VITAMIN K1) 100 MCG TABLET
ORAL_TABLET | Freq: Every day | ORAL | 11 refills | 30 days | Status: CP
Start: 2022-06-16 — End: 2023-06-16

## 2022-06-16 MED ORDER — WARFARIN 1 MG TABLET
ORAL_TABLET | Freq: Every day | ORAL | 0 refills | 30 days | Status: CP
Start: 2022-06-16 — End: 2022-07-16

## 2022-06-29 ENCOUNTER — Ambulatory Visit: Admit: 2022-06-29 | Discharge: 2022-06-30 | Payer: MEDICARE | Attending: Internal Medicine | Primary: Internal Medicine

## 2022-06-29 DIAGNOSIS — I2601 Septic pulmonary embolism with acute cor pulmonale: Principal | ICD-10-CM

## 2022-06-29 DIAGNOSIS — Z7901 Long term (current) use of anticoagulants: Principal | ICD-10-CM

## 2022-06-29 DIAGNOSIS — I2782 Chronic pulmonary embolism: Principal | ICD-10-CM

## 2022-06-29 DIAGNOSIS — I87009 Postthrombotic syndrome without complications of unspecified extremity: Principal | ICD-10-CM

## 2022-07-14 ENCOUNTER — Ambulatory Visit: Admit: 2022-07-14 | Payer: MEDICARE

## 2022-07-14 ENCOUNTER — Ambulatory Visit: Admit: 2022-07-14 | Discharge: 2022-08-12 | Payer: MEDICARE

## 2022-07-27 ENCOUNTER — Ambulatory Visit: Admit: 2022-07-27 | Discharge: 2022-07-28 | Payer: MEDICARE

## 2022-07-27 DIAGNOSIS — Z79899 Other long term (current) drug therapy: Principal | ICD-10-CM

## 2022-07-27 DIAGNOSIS — D6941 Evans syndrome: Principal | ICD-10-CM

## 2022-07-27 DIAGNOSIS — D6861 Antiphospholipid syndrome: Principal | ICD-10-CM

## 2022-07-27 DIAGNOSIS — H43399 Other vitreous opacities, unspecified eye: Principal | ICD-10-CM

## 2022-07-27 DIAGNOSIS — H5319 Other subjective visual disturbances: Principal | ICD-10-CM

## 2022-07-27 DIAGNOSIS — M3219 Other organ or system involvement in systemic lupus erythematosus: Principal | ICD-10-CM

## 2022-07-27 DIAGNOSIS — H04123 Dry eye syndrome of bilateral lacrimal glands: Principal | ICD-10-CM

## 2022-07-27 MED ORDER — FLAREX 0.1 % EYE DROPS,SUSPENSION
Freq: Four times a day (QID) | OPHTHALMIC | 0 refills | 25 days | Status: CP
Start: 2022-07-27 — End: 2022-08-17

## 2022-08-16 ENCOUNTER — Ambulatory Visit: Admit: 2022-08-16 | Discharge: 2022-09-11 | Payer: MEDICARE

## 2022-08-16 ENCOUNTER — Ambulatory Visit: Admit: 2022-08-16 | Payer: MEDICARE

## 2022-08-18 DIAGNOSIS — I2782 Chronic pulmonary embolism: Principal | ICD-10-CM

## 2022-08-18 DIAGNOSIS — I2601 Septic pulmonary embolism with acute cor pulmonale: Principal | ICD-10-CM

## 2022-08-18 DIAGNOSIS — I82431 Acute embolism and thrombosis of right popliteal vein: Principal | ICD-10-CM

## 2022-08-18 DIAGNOSIS — D6941 Evans syndrome: Principal | ICD-10-CM

## 2022-08-18 MED ORDER — WARFARIN 1 MG TABLET
ORAL_TABLET | Freq: Every day | ORAL | 0 refills | 30 days | Status: CP
Start: 2022-08-18 — End: 2022-09-17

## 2022-08-18 MED ORDER — WARFARIN 5 MG TABLET
ORAL_TABLET | Freq: Every day | ORAL | 0 refills | 30 days | Status: CP
Start: 2022-08-18 — End: 2022-09-17

## 2022-08-24 ENCOUNTER — Encounter: Payer: Medicaid Other | Admitting: Psychology

## 2022-09-13 ENCOUNTER — Ambulatory Visit: Admit: 2022-09-13 | Payer: MEDICARE

## 2022-09-20 ENCOUNTER — Ambulatory Visit: Admit: 2022-09-20 | Discharge: 2022-09-21 | Payer: MEDICARE

## 2022-09-21 ENCOUNTER — Ambulatory Visit: Admit: 2022-09-21 | Discharge: 2022-09-22 | Payer: MEDICARE

## 2022-09-28 ENCOUNTER — Ambulatory Visit: Admit: 2022-09-28 | Discharge: 2022-09-29 | Payer: MEDICARE | Attending: Internal Medicine | Primary: Internal Medicine

## 2022-09-28 DIAGNOSIS — D696 Thrombocytopenia, unspecified: Principal | ICD-10-CM

## 2022-09-28 DIAGNOSIS — D6861 Antiphospholipid syndrome: Principal | ICD-10-CM

## 2022-09-28 DIAGNOSIS — I8290 Acute embolism and thrombosis of unspecified vein: Principal | ICD-10-CM

## 2022-09-28 MED ORDER — WARFARIN 5 MG TABLET
ORAL_TABLET | 3 refills | 0 days | Status: CP
Start: 2022-09-28 — End: 2023-09-28

## 2022-11-22 DIAGNOSIS — I8290 Acute embolism and thrombosis of unspecified vein: Principal | ICD-10-CM

## 2022-11-28 ENCOUNTER — Ambulatory Visit: Admit: 2022-11-28 | Discharge: 2022-11-29 | Payer: MEDICARE

## 2022-11-28 DIAGNOSIS — M329 Systemic lupus erythematosus, unspecified: Principal | ICD-10-CM

## 2022-12-05 ENCOUNTER — Ambulatory Visit: Admit: 2022-12-05 | Discharge: 2022-12-06 | Payer: MEDICARE

## 2023-01-02 ENCOUNTER — Ambulatory Visit: Admit: 2023-01-02 | Payer: MEDICARE

## 2023-01-10 ENCOUNTER — Ambulatory Visit: Admit: 2023-01-10 | Discharge: 2023-01-11 | Payer: MEDICARE

## 2023-01-10 DIAGNOSIS — Z79899 Other long term (current) drug therapy: Principal | ICD-10-CM

## 2023-01-24 ENCOUNTER — Emergency Department: Admit: 2023-01-24 | Discharge: 2023-01-25 | Disposition: A | Discharge status: Home / Self Care | Payer: MEDICARE

## 2023-01-24 ENCOUNTER — Ambulatory Visit: Admit: 2023-01-24 | Discharge: 2023-01-25 | Disposition: A | Discharge status: Home / Self Care | Payer: MEDICARE

## 2023-01-24 DIAGNOSIS — S0990XA Unspecified injury of head, initial encounter: Principal | ICD-10-CM

## 2023-02-01 ENCOUNTER — Ambulatory Visit: Admit: 2023-02-01 | Discharge: 2023-02-02 | Payer: MEDICARE | Attending: Internal Medicine | Primary: Internal Medicine

## 2023-02-01 DIAGNOSIS — D6861 Antiphospholipid syndrome: Principal | ICD-10-CM

## 2023-02-01 DIAGNOSIS — D696 Thrombocytopenia, unspecified: Principal | ICD-10-CM

## 2023-02-25 DIAGNOSIS — I8290 Acute embolism and thrombosis of unspecified vein: Principal | ICD-10-CM

## 2023-02-25 MED ORDER — WARFARIN 5 MG TABLET
ORAL_TABLET | 0 refills | 0 days
Start: 2023-02-25 — End: ?

## 2023-02-26 MED ORDER — WARFARIN 5 MG TABLET
ORAL_TABLET | 0 refills | 0 days
Start: 2023-02-26 — End: ?

## 2023-02-28 ENCOUNTER — Ambulatory Visit: Admit: 2023-02-28 | Discharge: 2023-03-01 | Payer: MEDICARE | Attending: Family | Primary: Family

## 2023-02-28 DIAGNOSIS — M329 Systemic lupus erythematosus, unspecified: Principal | ICD-10-CM

## 2023-02-28 DIAGNOSIS — Z23 Encounter for immunization: Principal | ICD-10-CM

## 2023-03-05 ENCOUNTER — Emergency Department: Admit: 2023-03-05 | Discharge: 2023-03-06 | Disposition: A | Discharge status: Home / Self Care | Payer: MEDICARE

## 2023-03-05 ENCOUNTER — Ambulatory Visit: Admit: 2023-03-05 | Discharge: 2023-03-06 | Disposition: A | Discharge status: Home / Self Care | Payer: MEDICARE

## 2023-03-06 MED ORDER — NITROFURANTOIN MONOHYDRATE/MACROCRYSTALS 100 MG CAPSULE
ORAL_CAPSULE | Freq: Two times a day (BID) | ORAL | 0 refills | 7 days | Status: CP
Start: 2023-03-06 — End: 2023-03-13

## 2023-03-06 MED ORDER — HYDROCODONE 5 MG-ACETAMINOPHEN 325 MG TABLET
ORAL_TABLET | Freq: Four times a day (QID) | ORAL | 0 refills | 2 days | Status: CP | PRN
Start: 2023-03-06 — End: 2023-03-09

## 2023-03-13 DIAGNOSIS — M329 Systemic lupus erythematosus, unspecified: Principal | ICD-10-CM

## 2023-03-13 MED ORDER — HYDROXYCHLOROQUINE 200 MG TABLET
ORAL_TABLET | Freq: Every day | ORAL | 3 refills | 90 days
Start: 2023-03-13 — End: 2024-03-12

## 2023-03-14 MED ORDER — HYDROXYCHLOROQUINE 200 MG TABLET
ORAL_TABLET | Freq: Every day | ORAL | 3 refills | 90 days | Status: CP
Start: 2023-03-14 — End: 2024-03-13
  Filled 2023-03-19: qty 180, 90d supply, fill #0

## 2023-03-16 ENCOUNTER — Ambulatory Visit
Admit: 2023-03-16 | Discharge: 2023-03-17 | Payer: MEDICARE | Attending: Obstetrics & Gynecology | Primary: Obstetrics & Gynecology

## 2023-03-16 DIAGNOSIS — N83209 Unspecified ovarian cyst, unspecified side: Principal | ICD-10-CM

## 2023-05-11 ENCOUNTER — Other Ambulatory Visit: Payer: Self-pay

## 2023-05-11 ENCOUNTER — Emergency Department: Payer: Medicare Other

## 2023-05-11 ENCOUNTER — Emergency Department
Admission: EM | Admit: 2023-05-11 | Discharge: 2023-05-11 | Disposition: A | Discharge status: Home / Self Care | Payer: Medicare Other | Attending: Emergency Medicine | Admitting: Emergency Medicine

## 2023-05-11 DIAGNOSIS — Z7901 Long term (current) use of anticoagulants: Secondary | ICD-10-CM | POA: Insufficient documentation

## 2023-05-11 DIAGNOSIS — M5440 Lumbago with sciatica, unspecified side: Secondary | ICD-10-CM | POA: Diagnosis not present

## 2023-05-11 DIAGNOSIS — M545 Low back pain, unspecified: Secondary | ICD-10-CM | POA: Diagnosis present

## 2023-05-11 LAB — URINALYSIS, ROUTINE W REFLEX MICROSCOPIC
Bilirubin Urine: NEGATIVE
Glucose, UA: NEGATIVE mg/dL
Hgb urine dipstick: NEGATIVE
Ketones, ur: 5 mg/dL — AB
Nitrite: NEGATIVE
Protein, ur: NEGATIVE mg/dL
Specific Gravity, Urine: 1.018 (ref 1.005–1.030)
pH: 7 (ref 5.0–8.0)

## 2023-05-11 LAB — POC URINE PREG, ED: Preg Test, Ur: NEGATIVE

## 2023-05-11 MED ORDER — CYCLOBENZAPRINE HCL 10 MG PO TABS
5.0000 mg | ORAL_TABLET | Freq: Once | ORAL | Status: AC
  Administered 2023-05-11: 5 mg via ORAL
  Filled 2023-05-11: qty 1

## 2023-05-11 MED ORDER — CYCLOBENZAPRINE HCL 10 MG PO TABS: 10.0000 mg | ORAL_TABLET | Freq: Three times a day (TID) | ORAL | 0 refills | Status: AC | PRN

## 2023-05-11 MED ORDER — CYCLOBENZAPRINE HCL 10 MG PO TABS: 10.0000 mg | ORAL_TABLET | Freq: Three times a day (TID) | ORAL | 0 refills | Status: DC | PRN

## 2023-05-11 MED ORDER — LIDOCAINE 5 % EX PTCH
1.0000 | MEDICATED_PATCH | CUTANEOUS | Status: DC
  Administered 2023-05-11: 1 via TRANSDERMAL
  Filled 2023-05-11: qty 1

## 2023-05-11 MED ORDER — ACETAMINOPHEN 500 MG PO TABS
1000.0000 mg | ORAL_TABLET | Freq: Once | ORAL | Status: AC
  Administered 2023-05-11: 1000 mg via ORAL
  Filled 2023-05-11: qty 2

## 2023-05-11 NOTE — ED Notes (Signed)
 Pt c/o lower back pain that started after putting on jeans. Pt has year+ long history of chronic back pain but states it is never this intense. Pt assisted from wheelchair to bed.

## 2023-05-11 NOTE — Discharge Instructions (Addendum)
 Your evaluated in the ED for lower back pain.  Your urinalysis and lumbar x-ray are normal.  We will prescribe muscle relaxer.  Follow-up with your rheumatologic specialist as they can refer you to physical therapy.  You can also try contacting [facility] to schedule appointment without referral.  Lidocaine  patches or IcyHot patches can be placed over the affected area alternating with application of ice and heat using a heating pad.  You can pick these items up from your local pharmacy.  Please review sciatica patient education attached to your discharge papers.

## 2023-05-11 NOTE — ED Provider Notes (Signed)
 [facility] Emergency Department Provider Note     Event Date/Time   First MD Initiated Contact with Patient 05/11/23 1230     (approximate)   History   No chief complaint on file.   HPI  Jacqueline Black is a 28 y.o. 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  Patient reports she was trying to put on her jeans when she felt a sharp pain to her lower back with radiation to down bilateral legs.  Denies loss of bladder and bowel control, saddle anesthesia, recent illness or fever. No urinary symptoms.      Physical Exam   Triage Vital Signs: ED Triage Vitals  Encounter Vitals Group     BP 05/11/23 1054 (!) 154/125     Systolic BP Percentile --      Diastolic BP Percentile --      Pulse Rate 05/11/23 1054 80     Resp 05/11/23 1054 (!) 21     Temp 05/11/23 1053 98.1 F (36.7 C)     Temp src --      SpO2 05/11/23 1054 98 %     Weight 05/11/23 1053 230 lb (104.3 kg)     Height 05/11/23 1053 5' 6 (1.676 m)     Head Circumference --      Peak Flow --      Pain Score 05/11/23 1053 10     Pain Loc --      Pain Education --      Exclude from Growth Chart --     Most recent vital signs: Vitals:   05/11/23 1054 05/11/23 1412  BP: (!) 154/125 111/66  Pulse: 80 62  Resp: (!) 21 16  Temp:    SpO2: 98% 98%    General: Well appearing. Alert and oriented. INAD.  Skin:  Warm, dry and intact. No rashes or lesions noted.     Head:  NCAT.  Eyes:  PERRLA. EOMI.  CV:  Good peripheral perfusion. RRR.  RESP:  Normal effort. LCTAB. No retractions.  ABD:  No distention. BACK:  Spinous process is midline without deformity. Tenderness over L1-L2 region. (+) SLRT bilaterally.  MSK:   Full ROM in all joints. No swelling, deformity or tenderness.  NEURO: Cranial nerves II-XII intact. No focal deficits. Sensation and motor function intact. 5/5  muscle strength of UE & LE.   ED Results / Procedures / Treatments   Labs (all labs ordered are listed, but only abnormal results are displayed) Labs Reviewed  URINALYSIS, ROUTINE W REFLEX MICROSCOPIC - Abnormal; Notable for the following components:      Result Value   Color, Urine YELLOW (*)    APPearance HAZY (*)    Ketones, ur 5 (*)    Leukocytes,Ua SMALL (*)    Bacteria, UA FEW (*)    All other components within normal limits  POC URINE PREG, ED    RADIOLOGY  I personally viewed and evaluated these images as part of my medical decision making, as well as reviewing the written report by the radiologist.  ED Provider Interpretation: No bony abnormality noted on lumbar x-ray  DG Lumbar Spine Complete Result Date: 05/11/2023 CLINICAL DATA:  Low back pain radiating to the right leg. EXAM: LUMBAR SPINE - COMPLETE 4+ VIEW COMPARISON:  None Available. FINDINGS: There are 5 non rib-bearing lumbar type vertebral bodies. There  is mild straightening of the expected lumbar lordosis. No anterolisthesis or retrolisthesis. No definite pars defects. No significant scoliotic curvature. Lumbar vertebral body heights are preserved. Lumbar intervertebral disc space heights are preserved. Limited visualization of the bilateral SI joints is normal. An intrauterine device overlies the midline of the pelvis. Regional soft tissues appear normal. IMPRESSION: Mild straightening of the expected lumbar lordosis, nonspecific though could be seen in the setting of muscle spasm. Otherwise, no explanation for patient's low back pain. Electronically Signed   By: Norleen Roulette M.D.   On: 05/11/2023 14:01    PROCEDURES:  Critical Care performed: No  Procedures   MEDICATIONS ORDERED IN ED: Medications  lidocaine  (LIDODERM ) 5 % 1 patch (1 patch Transdermal Patch Applied 05/11/23 1459)  acetaminophen  (TYLENOL ) tablet 1,000 mg (1,000 mg Oral Given 05/11/23 1319)  cyclobenzaprine  (FLEXERIL ) tablet 5 mg (5 mg Oral  Given 05/11/23 1320)     IMPRESSION / MDM / ASSESSMENT AND PLAN / ED COURSE  I reviewed the triage vital signs and the nursing notes.                               28 y.o. female presents to the emergency department for evaluation and treatment of lower back pain without injury. See HPI for further details.   Differential diagnosis includes, but is not limited to sciatica, fracture less likely, muscle spasm, cauda equina low suspicion to consider  Patient's presentation is most consistent with acute complicated illness / injury requiring diagnostic workup.  Patient is alert and oriented.  She is hemodynamically stable.  Physical exam findings are pertinent for positive straight leg raise test bilaterally.  Urinalysis reveals small leukocytes and bacteria however patient is asymptomatic.  No indication for antibiotics at this time.  High suspicion for sciatica.  Given that there is no injury or trauma there is low suspicion for compression fracture or spinal hematoma. given patient's history and allergies she is limited on pain medication.  Tylenol  and Flexeril  given in the ED.  Patient is lying comfortably in ED bed.  I do believe she is in stable condition for discharge home with close follow-up with her PCP.  We also discussed of physical therapy and possible referral needed.  Encouraged to follow-up with rheumatology for possible flareup.  Patient agrees to this plan.  ED return precautions discussed.  FINAL CLINICAL IMPRESSION(S) / ED DIAGNOSES   Final diagnoses:  Acute midline low back pain with sciatica, sciatica laterality unspecified     Rx / DC Orders   ED Discharge Orders          Ordered    cyclobenzaprine  (FLEXERIL ) 10 MG tablet  3 times daily PRN,   Status:  Discontinued        05/11/23 1437    cyclobenzaprine  (FLEXERIL ) 10 MG tablet  3 times daily PRN        05/11/23 1450             Note:  This document was prepared using Dragon voice recognition software and  may include unintentional dictation errors.    Margrette, Ziaire Bieser A, PA-C 05/11/23 1631    Viviann Pastor, MD 05/12/23 2039

## 2023-05-11 NOTE — ED Triage Notes (Signed)
 Pt to ED [facility] from home for back pain radiating down right leg while putting on jeans this am. Pt crying in triage.  Reports hx of similar back issues.

## 2023-05-14 ENCOUNTER — Inpatient Hospital Stay: Admit: 2023-05-14 | Discharge: 2023-05-15 | Payer: MEDICARE

## 2023-05-14 ENCOUNTER — Ambulatory Visit: Admit: 2023-05-14 | Discharge: 2023-05-15 | Payer: MEDICARE

## 2023-05-14 DIAGNOSIS — S3992XA Unspecified injury of lower back, initial encounter: Principal | ICD-10-CM

## 2023-05-14 DIAGNOSIS — M5416 Radiculopathy, lumbar region: Principal | ICD-10-CM

## 2023-05-14 MED ORDER — GABAPENTIN 300 MG CAPSULE
ORAL_CAPSULE | Freq: Three times a day (TID) | ORAL | 3 refills | 90.00 days | Status: CP
Start: 2023-05-14 — End: 2024-05-13

## 2023-05-14 MED ORDER — PREDNISONE 10 MG TABLET
ORAL_TABLET | 0 refills | 0.00 days | Status: CP
Start: 2023-05-14 — End: ?

## 2023-05-16 ENCOUNTER — Ambulatory Visit: Admit: 2023-05-16 | Discharge: 2023-05-17 | Payer: MEDICARE | Attending: Family | Primary: Family

## 2023-05-16 DIAGNOSIS — M3219 Other organ or system involvement in systemic lupus erythematosus: Principal | ICD-10-CM

## 2023-05-30 ENCOUNTER — Ambulatory Visit
Admit: 2023-05-30 | Discharge: 2023-05-31 | Payer: MEDICARE | Attending: Physical Medicine & Rehabilitation | Primary: Physical Medicine & Rehabilitation

## 2023-05-30 DIAGNOSIS — M5416 Radiculopathy, lumbar region: Principal | ICD-10-CM

## 2023-05-30 MED ORDER — DIAZEPAM 5 MG TABLET
ORAL_TABLET | Freq: Once | ORAL | 0 refills | 1.00 days | Status: CP
Start: 2023-05-30 — End: 2023-05-30

## 2023-06-28 ENCOUNTER — Ambulatory Visit: Admit: 2023-06-28 | Discharge: 2023-06-29 | Payer: MEDICARE | Attending: Internal Medicine | Primary: Internal Medicine

## 2023-06-28 DIAGNOSIS — D6941 Evans syndrome: Principal | ICD-10-CM

## 2023-06-28 DIAGNOSIS — D6861 Antiphospholipid syndrome: Principal | ICD-10-CM

## 2023-06-28 DIAGNOSIS — D696 Thrombocytopenia, unspecified: Principal | ICD-10-CM

## 2023-07-03 ENCOUNTER — Encounter: Payer: Medicare Other | Attending: Psychology | Admitting: Psychology

## 2023-07-03 DIAGNOSIS — Z7901 Long term (current) use of anticoagulants: Secondary | ICD-10-CM

## 2023-07-03 DIAGNOSIS — F411 Generalized anxiety disorder: Secondary | ICD-10-CM

## 2023-07-03 DIAGNOSIS — G4733 Obstructive sleep apnea (adult) (pediatric): Secondary | ICD-10-CM

## 2023-07-03 DIAGNOSIS — F331 Major depressive disorder, recurrent, moderate: Secondary | ICD-10-CM

## 2023-07-03 DIAGNOSIS — G4701 Insomnia due to medical condition: Secondary | ICD-10-CM

## 2023-07-03 DIAGNOSIS — R41840 Attention and concentration deficit: Secondary | ICD-10-CM | POA: Insufficient documentation

## 2023-07-11 ENCOUNTER — Ambulatory Visit: Admit: 2023-07-11 | Discharge: 2023-07-12 | Payer: MEDICARE

## 2023-07-11 DIAGNOSIS — H3589 Other specified retinal disorders: Principal | ICD-10-CM

## 2023-07-11 DIAGNOSIS — H359 Unspecified retinal disorder: Principal | ICD-10-CM

## 2023-07-16 ENCOUNTER — Ambulatory Visit: Payer: Self-pay | Admitting: Psychology

## 2023-07-20 DIAGNOSIS — D6941 Evans syndrome: Principal | ICD-10-CM

## 2023-07-23 ENCOUNTER — Emergency Department: Admit: 2023-07-23 | Discharge: 2023-07-23 | Disposition: A | Discharge status: Home / Self Care

## 2023-07-23 ENCOUNTER — Emergency Department: Admit: 2023-07-23 | Discharge: 2023-07-23 | Disposition: A | Discharge status: Home / Self Care | Payer: MEDICARE

## 2023-07-23 DIAGNOSIS — R079 Chest pain, unspecified: Principal | ICD-10-CM

## 2023-07-24 ENCOUNTER — Encounter

## 2023-07-24 DIAGNOSIS — D6941 Evans syndrome: Principal | ICD-10-CM

## 2023-07-24 DIAGNOSIS — Z1159 Encounter for screening for other viral diseases: Principal | ICD-10-CM

## 2023-07-24 DIAGNOSIS — D5911 Warm autoimmune hemolytic anemia (CMS-HCC): Principal | ICD-10-CM

## 2023-07-24 DIAGNOSIS — R799 Abnormal finding of blood chemistry, unspecified: Principal | ICD-10-CM

## 2023-07-25 DIAGNOSIS — D6941 Evans syndrome: Principal | ICD-10-CM

## 2023-07-25 DIAGNOSIS — R799 Abnormal finding of blood chemistry, unspecified: Principal | ICD-10-CM

## 2023-07-25 DIAGNOSIS — Z1159 Encounter for screening for other viral diseases: Principal | ICD-10-CM

## 2023-07-25 DIAGNOSIS — D5911 Warm autoimmune hemolytic anemia (CMS-HCC): Principal | ICD-10-CM

## 2023-07-27 ENCOUNTER — Encounter (HOSPITAL_BASED_OUTPATIENT_CLINIC_OR_DEPARTMENT_OTHER)

## 2023-07-27 DIAGNOSIS — D6941 Evans syndrome: Principal | ICD-10-CM

## 2023-07-27 DIAGNOSIS — R799 Abnormal finding of blood chemistry, unspecified: Principal | ICD-10-CM

## 2023-07-27 DIAGNOSIS — D5911 Warm autoimmune hemolytic anemia: Principal | ICD-10-CM

## 2023-07-27 DIAGNOSIS — Z1159 Encounter for screening for other viral diseases: Principal | ICD-10-CM

## 2023-07-27 DIAGNOSIS — R41840 Attention and concentration deficit: Secondary | ICD-10-CM

## 2023-07-27 NOTE — Progress Notes (Signed)
   Behavioral Observations: The patient appeared well-groomed and appropriately dressed. Her manners were polite and appropriate to the situation. Some mild fatigue was noted around the middle of the testing session. The patient's overall attitude towards testing was positive and her effort was good.   Neuropsychology Note  Jacquetta Polhamus completed 90 minutes of neuropsychological testing with technician, Staci Acosta, BA, under the supervision of Arley Phenix, PsyD., Clinical Neuropsychologist. The patient did not appear overtly distressed by the testing session, per behavioral observation or via self-report to the technician. Rest breaks were offered.   Clinical Decision Making: In considering the patient's current level of functioning, level of presumed impairment, nature of symptoms, emotional and behavioral responses during clinical interview, level of literacy, and observed level of motivation/effort, a battery of tests was selected by Dr. Kieth Brightly during initial consultation on 07/03/2023. This was communicated to the technician. Communication between the neuropsychologist and technician was ongoing throughout the testing session and changes were made as deemed necessary based on patient performance on testing, technician observations and additional pertinent factors such as those listed above.  Tests Administered: Comprehensive Attention Battery (CAB) Continuous Performance Test (CPT)  Results: Will be included in final report   Feedback to Patient: Kyleigha Markert will return on 09/11/2023 for an interactive feedback session with Dr. Kieth Brightly at which time her test performances, clinical impressions and treatment recommendations will be reviewed in detail. The patient understands she can contact our office should she require our assistance before this time.  90 minutes spent face-to-face with patient administering standardized tests, 30 minutes spent scoring Radiographer, therapeutic).  [CPT [id number], [id number]]  Full report to follow.

## 2023-07-30 DIAGNOSIS — R799 Abnormal finding of blood chemistry, unspecified: Principal | ICD-10-CM

## 2023-07-30 DIAGNOSIS — D6941 Evans syndrome: Principal | ICD-10-CM

## 2023-07-30 DIAGNOSIS — D5911 Warm autoimmune hemolytic anemia: Principal | ICD-10-CM

## 2023-07-30 DIAGNOSIS — Z1159 Encounter for screening for other viral diseases: Principal | ICD-10-CM

## 2023-07-31 DIAGNOSIS — D5911 Warm autoimmune hemolytic anemia: Principal | ICD-10-CM

## 2023-07-31 DIAGNOSIS — D6941 Evans syndrome: Principal | ICD-10-CM

## 2023-07-31 DIAGNOSIS — R799 Abnormal finding of blood chemistry, unspecified: Principal | ICD-10-CM

## 2023-07-31 DIAGNOSIS — Z1159 Encounter for screening for other viral diseases: Principal | ICD-10-CM

## 2023-08-01 ENCOUNTER — Inpatient Hospital Stay: Admit: 2023-08-01 | Discharge: 2023-08-02

## 2023-08-01 DIAGNOSIS — R799 Abnormal finding of blood chemistry, unspecified: Principal | ICD-10-CM

## 2023-08-01 DIAGNOSIS — Z1159 Encounter for screening for other viral diseases: Principal | ICD-10-CM

## 2023-08-01 DIAGNOSIS — D6941 Evans syndrome: Principal | ICD-10-CM

## 2023-08-01 DIAGNOSIS — D5911 Warm autoimmune hemolytic anemia: Principal | ICD-10-CM

## 2023-08-03 DIAGNOSIS — Z1159 Encounter for screening for other viral diseases: Principal | ICD-10-CM

## 2023-08-03 DIAGNOSIS — D5911 Warm autoimmune hemolytic anemia: Principal | ICD-10-CM

## 2023-08-03 DIAGNOSIS — D6941 Evans syndrome: Principal | ICD-10-CM

## 2023-08-03 DIAGNOSIS — R799 Abnormal finding of blood chemistry, unspecified: Principal | ICD-10-CM

## 2023-08-07 ENCOUNTER — Inpatient Hospital Stay: Admit: 2023-08-07 | Discharge: 2023-08-08

## 2023-08-15 ENCOUNTER — Encounter: Attending: Psychology | Admitting: Psychology

## 2023-08-15 DIAGNOSIS — Z1159 Encounter for screening for other viral diseases: Principal | ICD-10-CM

## 2023-08-15 DIAGNOSIS — R799 Abnormal finding of blood chemistry, unspecified: Principal | ICD-10-CM

## 2023-08-15 DIAGNOSIS — D5911 Warm autoimmune hemolytic anemia: Principal | ICD-10-CM

## 2023-08-15 DIAGNOSIS — F4001 Agoraphobia with panic disorder: Secondary | ICD-10-CM | POA: Diagnosis present

## 2023-08-15 DIAGNOSIS — G4733 Obstructive sleep apnea (adult) (pediatric): Secondary | ICD-10-CM | POA: Insufficient documentation

## 2023-08-15 DIAGNOSIS — F331 Major depressive disorder, recurrent, moderate: Secondary | ICD-10-CM | POA: Insufficient documentation

## 2023-08-15 DIAGNOSIS — F411 Generalized anxiety disorder: Secondary | ICD-10-CM | POA: Diagnosis present

## 2023-08-15 NOTE — Progress Notes (Signed)
 Neuropsychological Evaluation   Patient:  Jacqueline Black   DOB: 13-Apr-1996  MR Number: [id number]  Location: [facility] [address] [place], [address] [place] [address] Dept: [phone]  Start: 9 AM End: 10 AM  Provider/Observer:     Hershal Coria PsyD  Chief Complaint:      Chief Complaint  Patient presents with   Anxiety   Depression   Sleeping Problem   Panic Attack   Other    Attention and concentration difficulties   Pain    Reason For Service:     Jacqueline Black is a 28 year old female referred for neuropsychological evaluation by her treating psychiatrist Jacqueline Longs, MD to facilitate differential diagnostic concerns around possible diagnosis of adult residual attention deficit disorder, autism spectrum type disorder with a longstanding history of anxiety, depression and recent development of panic attacks.  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  Patient has a prior history of DVT, pulmonary embolism and is on current anticoagulant therapies.  Previous diagnosis of obstructive sleep apnea with noncompliance to CPAP prescription.  Patient has described long standing struggles with depression and anxiety over several years that have included sadness, anhedonia, loss of motivation, feelings of helplessness and hopelessness and recurrent thoughts of death.  Ongoing significant sleep disturbance and reduced appetite with concentration and attention problems noted.  Excessive worrying, nervousness and anxiety with feelings of being on edge constantly, trouble relaxing, irritability and fearfulness are all noted.  Patient has had panic attacks going on for the past several years.  Patient currently on sertraline without significant side  effects and improvement to some degree in anxiety and depression but no significant impact on panic events.  Chronic sleep problems are noted with difficulty falling asleep and maintaining sleep.  Patient was prescribed CPAP device previously but is noncompliant.  Patient notes struggle with attention and focus/concentration deficits with primary difficulty staying on task with difficulty keeping up with projects, struggling with organization and being easily distracted.  Patient denies significant difficulties with attentional issues in school and notes that her problems started sometime around age 5.  Patient has noted there is a history of diagnosis of attention deficit disorder in her family with her son previously being diagnosed and notes that she would like to be assessed for this possibility to "see if her son gets it from her."  Patient has chronic illness from her systemic lupus and Evan syndrome and has been unable to work and on current disability.  Patient notes chronic thoughts of not wanting to be here (no specific plan or intent to harm herself) and feels like she is a burden on her family.   During today's clinical visit the patient reports that she would like to be assessed for possible ADD and autism spectrum disorder type symptoms.  Patient notes that her difficulties with attention really started in late middle school but that she always had some degree of difficulties with attention and focusing.  Patient had difficulty developing reading skills and was easily distracted with difficulty comprehending what she read.  Patient describes more inattention and hyperkinetic behaviors.  Patient reports that she has had no panic events since January of this year.   Patient notes that when she was first diagnosed with Evan syndrome around 2014 that her anxiety, depression and panic  events really started.  Patient had 4 blood clots in her right leg in 2018 and then had pulmonary embolism in 2018.   Patient has been on chronic blood thinning agents since this time.  Patient notes that she worries about her significant medical illness/chronic issues including antiphospholipid syndrome, Evan syndrome and systemic lupus.   Patient notes that extensive family history of attentional difficulties and other psychiatric issues in the family.  The patient reports that her parents, brother and son have all been diagnosed or had difficulties with attention and concentration.  Her son is also been diagnosed with mild spectrum autistic disorder.   Medical History:                         Past Medical History:  Diagnosis Date   Anxiety     Depression     Lupus                                                                 Patient Active Problem List    Diagnosis Date Noted   Anxiety disorder 02/16/2022   Attention and concentration deficit 02/16/2022   Insomnia due to medical condition 02/16/2022   Iron deficiency anemia 12/18/2019   History of venous thromboembolism 11/05/2019   SLE (systemic lupus erythematosus) (HCC) 08/13/2019   Epistaxis 03/21/2019   BMI 36.0-36.9,adult 02/11/2019   Immunocompromised due to corticosteroids (HCC) 02/11/2019   Arthralgia of right ankle 11/06/2018   Abnormal uterine bleeding 09/27/2018   Cervical radiculopathy 09/27/2018   Floaters with photopsia 07/10/2018   Long term current use of anticoagulant therapy 06/18/2018   Onychomycosis 06/03/2018   Dry eye syndrome of bilateral lacrimal glands 04/09/2018   Emmetropia 04/09/2018   Long-term use of Plaquenil 04/09/2018   Headache, unspecified 03/25/2018   Hyperbilirubinemia 01/25/2018   MDD (major depressive disorder), recurrent episode, moderate (HCC) 04/19/2017   Warfarin therapy started 11/13/2016   Acute deep vein thrombosis (DVT) of popliteal vein of right lower extremity (HCC) 10/12/2016   Antiphospholipid antibody syndrome (HCC) 10/12/2016   Pulmonary emboli (HCC) 10/12/2016   Eczema 11/19/2014    Obstructive sleep apnea syndrome in adult 11/19/2014   Epigastric pain 09/22/2014   Psoriasis 09/22/2014   Cholelithiasis 07/01/2014   Gastroesophageal reflux disease 07/01/2014   Steatosis of liver 07/01/2014   Depression 12/12/2013   Encounter for therapeutic drug monitoring 09/19/2013   Positive ANA (antinuclear antibody) 07/24/2013   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   Progression of Symptoms: Patient's anxiety and depression have been exacerbated and worsened with significant autoimmune disorders.   Additional Tests and Measures from other records:   Neuroimaging Results: No neuroimaging available in EMR   Sleep: Patient describes difficulty falling asleep and also difficulty waking up in the morning.  Patient has previously been diagnosed with obstructive sleep apnea and does not use CPAP device although it was prescribed previously.  Tests Administered: Comprehensive Attention Battery (CAB) Continuous  Performance Test (CPT)  Participation Level:   Active with some brief episodes of somnolence/fatigue during testing procedures but overall good motivation and effort.  Participation Quality:  Appropriate      Behavioral Observation:  The patient appeared well-groomed and appropriately dressed. Her manners were polite and appropriate to the situation. Some mild fatigue was noted around the middle of the testing session. The patient's overall attitude towards testing was positive and her effort was good.  The patient noted that she had slept well the night before with 7 hours of sleep and there were times of restlessness and fidgety  behaviors during the testing but overall she was able to comply with testing procedures.  Well Groomed, Alert, and Appropriate.   Test Results:   Initially, an estimation of the validity of the current assessment was conducted.  Behavioral observations suggested good participation with some isolated times of fatigue likely due to chronic sleep difficulties but overall she maintained good effort.  Embedded validity checks within the comprehensive attention battery all suggest good effort on this measure with no attempts to exaggerate or indicate deficits that were present.  This does appear to be a fair and valid assessment of the patient's current status.  The patient had a review of autistic spectrum type symptoms and was administered the adult repetitive behavior questionnaire-2.  The patient produced a score of 22 on this questionnaire with focal elevations having to do with adherence to routine and rigidity/inflexibility components and preoccupation/restricted patterns of interest noted.  Her score on this questionnaire were below the threshold typically seen with autistic spectrum type disorder but more consistent with significant anxiety type symptoms.  The patient was administered the comprehensive attention battery along with the CAB CPT measures.  Again, validity checks including pure auditory and visual reaction time measures and embedded aspects of the auditory and visual encoding measures were all within normal limits without indication of questions around effort levels.  On the pure visual reaction time measure the patient correctly responded to 47 of 50 targets with 3 errors of omission.  I suspect those 3 errors of omission were the patient becoming familiarized with pressure sensitive touch screen that is different than cell phone touch screens for those 3 omissions.  Even with that they were within normal limits regarding accuracy scores.  The patient's pure visual reaction time was 326 ms  which is within normal limits.  On the pure auditory reaction time measure she correctly responded to 50 of 50 targets with no errors of omission.  Average response time was 410 ms which is within normal limits.  The patient then completed the discriminate reaction time measures.  On the visual discriminate reaction time measure the patient correctly responded to 32 of 35 targets with 3 errors of omission.  She did initially struggle some on understanding the instructions on this measure but quickly caught onto strategies.  Average response time was 538 ms which were all within normative expectations.  On the auditory discriminate reaction time measure she correctly responded to 35 of 35 targets with no errors of commission and no errors of omission.  Average response time was 851 ms which is more than 1 standard deviation slower than normative comparison groups.  On the shift discriminate reaction time measure the patient did quite well.  She correctly responded to 30 of 30 targets with no errors of commission and no errors of omission.  Average response time was 750 ms which is within normative expectations.  The patient  was able to effectively shift between changing target stimuli effectively.  The patient was administered the auditory/visual scan reaction time measures.  The patient did quite well on all 3 of these measures including visual, auditory and mixed scan reaction time measures.  The patient had no more than 2 errors of omission on any individual measure and no errors of commission.  Accuracy scores were all within normative expectations.  As seen on the discriminate reaction time measure the patient had some slowed response times on the auditory scan reaction time measure performing just over 1 standard deviation slower than her normative comparison group.  The patient was within normal limits on the visual and mixed scan reaction time measures.  On the auditory/visual encoding measures the  patient did quite well.  The patient performed roughly 1 standard deviation to is much as 1-1/2 standard deviations better than her normative comparison group.  The patient did particularly well on the auditory backwards and visual backwards subtest and all elements of auditory and visual encoding were within normal limits and the patient was able to actively process information in her auditory and visual registers.  The patient was administered the Stroop interference cancellation test.  This initially starts out as a primary focus execute cognitive challenge requiring visual scanning, visual searching and speed operations.  After 4 trials or series of 9 interference this challenge changes by adding a targeted auditory interference.  While the patient is instructed this will occur they do not get a chance to practice doing the challenge/task under the auditory interference.  The patient performed within normal limits on the first 4 9 interference trials reaching a peak performance on the third series of 12 of 18 targets identified within the 15 seconds allotted.  The patient showed no significant decrease in functioning as the external targeted auditory interference is applied and her performance only drops by 1 identified target on the first series of targeted interference.  The patient's performance improves and she actually achieves her highest level of performance on the third series of the targeted interference trial where she gets 14 of 18 targets correct.  There were no indications of significant information processing speed deficits or focus execute deficits and the patient is able to effectively avoid distractibility from targeted external distractors.  Finally, the patient was administered the visual monitor continual performance test from the comprehensive attention battery.  This is a 15-minute visual discriminate reaction time measure that is broken down into five 3-minute blocks of time for  analysis.  On the first 3 minutes of this measure the patient correctly responds to 30 of 30 targets with no errors of omission and no errors of commission.  Average response time was 397 ms which is all well within normative expectations.  The patient's performance remains extremely steady and consistent throughout the entire 15 minutes of this task and the patient had only 2 total errors of omission throughout the entire 15 minutes.  By the 12-15-minute mark the patient's average response time was 410 ms which is essentially identical to her first 3 minutes of performance and there was no block of time with significant decrease in average response times.  This measure did not show any deficits with regard to sustaining attention.  Impression/Diagnosis:  The results of the current neuropsychological evaluation are consistent with a primary diagnosis of major depressive disorder that is recurrent and significant anxiety disorder with both generalized anxiety features as well as panic attacks with some degree of agoraphobia.  Neither the patient's review of autistic types of behaviors or objective assessment of a wide range of attentional variables identified particular abnormalities suggestive of a diagnosis of mild autistic spectrum disorder or adult residual attention deficit disorder.  The patient's difficulties psychiatrically and psychologically really appear to have developed around age 89.  While the patient does have some rigidity and inflexibility around behavioral patterns and social interaction and preoccupations and restricted patterns of interest noted related to autistic type features I suspect that most of these have to do with significant and long-term severe anxiety disorder.  I also suspect that the primary attentional issues the patient experiences on a day-to-day basis are due to anxieties impact on attentional abilities and depressive features impacting motivation and anhedonia.  Again,  objective assessment of a wide range of attentional abilities including sustained attention, freedom from distractibility, impulse control capacity, focus execute abilities and information processing speed and auditory and visual encoding were all within normal limits.  The patient was not particularly distracted by external distractors and I think the internal distractors related to her anxiety disorder play a primary role in subjective experiences of attentional deficits and depressive features playing a role in her engagement and completion of tasks.  Patient's current psychotropic regimen including sertraline appears to be appropriate given the features that are there.  The patient has been diagnosed with significant autoimmune conditions that have heightened her focus on how she is functioning and worries and concerns about her long-term status.  Patient's self-esteem and expectations around her future are all being significantly impacted from a psychological perspective with these medical complications.  Also of importance of note, is the patient has been diagnosed with obstructive sleep apnea in the past and has not been compliant with CPAP device.  I would strongly advise that the patient readdress these issues as untreated obstructive sleep apnea can exacerbate depression and anxiety type symptoms and have a significant impact on attention and short-term memory as it will disproportionately impact REM sleep aspects of sleep patterns that play a crucial role in memory and learning.  I will sit down with the patient for a scheduled feedback visit on 09/11/2023 and go over the results of the current evaluation and make more specific recommendations to the patient herself.   Diagnosis:    MDD (major depressive disorder), recurrent episode, moderate (HCC)  Generalized anxiety disorder  Panic disorder with agoraphobia  Obstructive sleep apnea syndrome in adult   _____________________ Chapman Commodore, Psy.D. Clinical Neuropsychologist

## 2023-08-15 NOTE — Progress Notes (Signed)
 Neuropsychological Consultation   Patient:   Jacqueline Black   DOB:   March 08, 1996  MR Number:  [id number]  Location:  [facility] FOR PAIN AND REHABILITATIVE MEDICINE [facility] PHYSICAL MEDICINE AND REHABILITATION [address] [place], [address] [place] [address] Dept: [phone]           Date of Service:   07/03/2023  Location of Service and Individuals present: Today's visit was conducted in my outpatient clinic office with the patient myself present.  Start Time:   9 AM End Time:   11 AM  Today's visit consisted of a formal face-to-face clinical interview of 1 hour and 15 minutes duration with 45 minutes spent in records review, report writing and setting up testing protocols.  Patient Consent and Confidentiality: Limits of confidentiality were reviewed including the fact that she had been referred for neuropsychological evaluation with results to be provided to her referring psychiatrist as well as made available in the patient's EMR for other medical professionals to have access to when appropriate.  Patient consents and understands the purpose of the referral.  Consent for Evaluation and Treatment:  Signed:  Yes Explanation of Privacy Policies:  Signed:  Yes Discussion of Confidentiality Limits:  Yes  Provider/Observer:  Arley Phenix, Psy.D.       Clinical Neuropsychologist       Billing Code/Service: [id number]  Chief Complaint:     Chief Complaint  Patient presents with   Anxiety   Depression   Other    Attentional deficits    Reason for Service:    Jacqueline Black is a 28 year old female referred for neuropsychological evaluation by her treating psychiatrist Jacqueline Longs, MD to facilitate differential diagnostic concerns around possible diagnosis of adult residual attention deficit disorder, autism spectrum type disorder with a longstanding history of anxiety, depression and recent development of panic attacks.  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  Patient has a prior history of DVT, pulmonary embolism and is on current anticoagulant therapies.  Previous diagnosis of obstructive sleep apnea with noncompliance to CPAP prescription.  Patient has described long standing struggles with depression and anxiety over several years that have included sadness, anhedonia, loss of motivation, feelings of helplessness and hopelessness and recurrent thoughts of death.  Ongoing significant sleep disturbance and reduced appetite with concentration and attention problems noted.  Excessive worrying, nervousness and anxiety with feelings of being on edge constantly, trouble relaxing, irritability and fearfulness are all noted.  Patient has had panic attacks going on for the past several years.  Patient currently on sertraline without significant side effects and improvement to some degree in anxiety and depression but no significant impact on panic events.  Chronic sleep problems are noted with difficulty falling asleep and maintaining sleep.  Patient was prescribed CPAP device previously but is noncompliant.  Patient notes struggle with attention and focus/concentration deficits with primary difficulty staying on task with difficulty keeping up with projects, struggling with organization and being easily distracted.  Patient denies significant difficulties with attentional issues in school and notes that her problems started sometime around age 29.  Patient has noted there is a history of diagnosis of attention deficit disorder in her family with her son previously being diagnosed and notes that she would like to be assessed for this possibility to "see if her son gets it from her."  Patient has chronic  illness from her systemic lupus and Evan syndrome and has been unable to work and on current disability.  Patient notes chronic  thoughts of not wanting to be here (no specific plan or intent to harm herself) and feels like she is a burden on her family.  During today's clinical visit the patient reports that she would like to be assessed for possible ADD and autism spectrum disorder type symptoms.  Patient notes that her difficulties with attention really started in late middle school but that she always had some degree of difficulties with attention and focusing.  Patient had difficulty developing reading skills and was easily distracted with difficulty comprehending what she read.  Patient describes more inattention and hyperkinetic behaviors.  Patient reports that she has had no panic events since January of this year.  Patient notes that when she was first diagnosed with Evan syndrome around 2014 that her anxiety, depression and panic events really started.  Patient had 4 blood clots in her right leg in 2018 and then had pulmonary embolism in 2018.  Patient has been on chronic blood thinning agents since this time.  Patient notes that she worries about her significant medical illness/chronic issues including antiphospholipid syndrome, Evan syndrome and systemic lupus.  Patient notes that extensive family history of attentional difficulties and other psychiatric issues in the family.  The patient reports that her parents, brother and son have all been diagnosed or had difficulties with attention and concentration.  Her son is also been diagnosed with mild spectrum autistic disorder.  Medical History:   Past Medical History:  Diagnosis Date   Anxiety    Depression    Lupus          Patient Active Problem List   Diagnosis Date Noted   Anxiety disorder 02/16/2022   Attention and concentration deficit 02/16/2022   Insomnia due to medical condition 02/16/2022   Iron deficiency anemia 12/18/2019   History of venous thromboembolism 11/05/2019   SLE (systemic lupus erythematosus) (HCC) 08/13/2019   Epistaxis 03/21/2019    BMI 36.0-36.9,adult 02/11/2019   Immunocompromised due to corticosteroids (HCC) 02/11/2019   Arthralgia of right ankle 11/06/2018   Abnormal uterine bleeding 09/27/2018   Cervical radiculopathy 09/27/2018   Floaters with photopsia 07/10/2018   Long term current use of anticoagulant therapy 06/18/2018   Onychomycosis 06/03/2018   Dry eye syndrome of bilateral lacrimal glands 04/09/2018   Emmetropia 04/09/2018   Long-term use of Plaquenil 04/09/2018   Headache, unspecified 03/25/2018   Hyperbilirubinemia 01/25/2018   MDD (major depressive disorder), recurrent episode, moderate (HCC) 04/19/2017   Warfarin therapy started 11/13/2016   Acute deep vein thrombosis (DVT) of popliteal vein of right lower extremity (HCC) 10/12/2016   Antiphospholipid antibody syndrome (HCC) 10/12/2016   Pulmonary emboli (HCC) 10/12/2016   Eczema 11/19/2014   Obstructive sleep apnea syndrome in adult 11/19/2014   Epigastric pain 09/22/2014   Psoriasis 09/22/2014   Cholelithiasis 07/01/2014   Gastroesophageal reflux disease 07/01/2014   Steatosis of liver 07/01/2014   Depression 12/12/2013   Encounter for therapeutic drug monitoring 09/19/2013   Positive ANA (antinuclear antibody) 07/24/2013   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  Progression of Symptoms: Patient's anxiety and  depression have been exacerbated and worsened with significant autoimmune disorders.  Additional Tests and Measures from other records:  Neuroimaging Results: No neuroimaging available in EMR  Sleep: Patient describes difficulty falling asleep and also difficulty waking up in the morning.   Patient has previously been diagnosed with obstructive sleep apnea and does not use CPAP device although it was prescribed previously.  Behavioral Observation/Mental Status:   Jacqueline Black  presents as a 28 y.o.-year-old Right handed Hispanic Female who appeared her stated age. her dress was Appropriate and she was Well Groomed and her manners were Appropriate to the situation.  her participation was indicative of Appropriate and Redirectable behaviors.  There were not physical disabilities noted.  she displayed an appropriate level of cooperation and motivation.    Interactions:    Active Redirectable  Attention:   abnormal and attention span appeared shorter than expected for age  Memory:   within normal limits; recent and remote memory intact  Visuo-spatial:   not examined  Speech (Volume):  low  Speech:   normal; normal  Thought Process:  Coherent and Relevant  Coherent and Linear  Though Content:  Rumination; not suicidal and not homicidal  Orientation:   person, place, time/date, and situation  Judgment:   Fair  Planning:   Fair  Affect:    Anxious  Mood:    Anxious  Insight:   Fair  Intelligence:   normal  Marital Status/Living:  Patient was born and raised in [place] along with 2 siblings.  Patient has had past relationships but she is currently single.  Patient has an 78-year-old son.  Her son's biological father is not involved in the patient's son's care.  Patient currently lives along with her son in [place] with her parents.  Patient has gone through the Social Security disability process and has her disability determination due to chronic medical problems including systemic lupus and other autoimmune disease.  Educational and Occupational History:     Highest Level of Education:   Patient graduated from high school maintaining a high C low B average and has taken some college courses without degree.  Patient reports that  her best subject in school was math and had some relative difficulty with history type subjects.  Current Occupation:    Patient has done some work with Landscape architect and has previously worked at [employer] with longest individual employment of 1-1/2 years.  Patient is disabled.  Hobbies and Interests: Patient is enjoyed dancing and doing nails but has increasing orthopedic issues related to her autoimmune disorder.  Impact of Symptoms on Work or School:  Patient has been increasingly concerned about impact on her capacity to work and is disabled and constantly worries about being a burden towards her family due to her inability to work consistently or at all.  Psychiatric History: Patient with significant history of anxiety and depression and ruminative thoughts with panic attacks.  Most of these issues developed around age 65.   Abuse/Trauma History: No history of abuse or traumatic experience  Family Med/Psych History:  Family History  Problem Relation Age of Onset   Depression Mother     Impression/DX:   Jacqueline Black is a 28 year old female referred for neuropsychological evaluation by her treating psychiatrist Jacqueline Longs, MD to facilitate differential diagnostic concerns around possible diagnosis of adult residual attention deficit disorder, autism spectrum type disorder with a longstanding history of anxiety, depression and recent development of panic attacks.  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  Patient has a prior history of DVT, pulmonary embolism and is on current anticoagulant therapies.  Previous diagnosis of obstructive sleep apnea with noncompliance to CPAP prescription.  Patient has described long standing struggles with depression and anxiety over several years that have included sadness, anhedonia, loss of  motivation, feelings of helplessness and hopelessness and recurrent thoughts of death.  Ongoing significant sleep disturbance and reduced appetite with concentration and attention problems noted.  Excessive worrying, nervousness and anxiety with feelings of being on edge constantly, trouble relaxing, irritability and fearfulness are all noted.  Patient has had panic attacks going on for the past several years.  Patient currently on sertraline without significant side effects and improvement to some degree in anxiety and depression but no significant impact on panic events.  Chronic sleep problems are noted with difficulty falling asleep and maintaining sleep.  Patient was prescribed CPAP device previously but is noncompliant.  Patient notes struggle with attention and focus/concentration deficits with primary difficulty staying on task with difficulty keeping up with projects, struggling with organization and being easily distracted.  Patient denies significant difficulties with attentional issues in school and notes that her problems started sometime around age 26.  Patient has noted there is a history of diagnosis of attention deficit disorder in her family with her son previously being diagnosed and notes that she would like to be assessed for this possibility to "see if her son gets it from her."  Patient has chronic illness from her systemic lupus and Evan syndrome and has been unable to work and on current disability.  Patient notes chronic thoughts of not wanting to be here (no specific plan or intent to harm herself) and feels like she is a burden on her family.  Disposition/Plan:  We have set the patient up for formal neuropsychological assessment and she will complete an objective assessment of attention and concentration/executive functioning as well as complete the RB Q-2A assessment of autistic spectrum type symptoms.  Diagnosis:    Attention and concentration deficit  MDD (major depressive  disorder), recurrent episode, moderate (HCC)  Generalized anxiety disorder  Insomnia due to medical condition  Obstructive sleep apnea syndrome in adult  Long term current use of anticoagulant therapy        Note: This document was prepared using Dragon voice recognition software and may include unintentional dictation errors.   Electronically Signed   _______________________ Chapman Commodore, Psy.D. Clinical Neuropsychologist

## 2023-08-17 DIAGNOSIS — R799 Abnormal finding of blood chemistry, unspecified: Principal | ICD-10-CM

## 2023-08-17 DIAGNOSIS — Z1159 Encounter for screening for other viral diseases: Principal | ICD-10-CM

## 2023-08-17 DIAGNOSIS — D5911 Warm autoimmune hemolytic anemia: Principal | ICD-10-CM

## 2023-08-20 ENCOUNTER — Encounter: Payer: Medicaid Other | Admitting: Psychology

## 2023-09-11 ENCOUNTER — Encounter: Admitting: Psychology

## 2023-10-08 ENCOUNTER — Ambulatory Visit: Admit: 2023-10-08 | Payer: MEDICARE

## 2023-11-13 ENCOUNTER — Encounter: Payer: Self-pay | Admitting: Psychology

## 2023-11-13 ENCOUNTER — Encounter: Attending: Psychology | Admitting: Psychology

## 2023-11-13 DIAGNOSIS — F411 Generalized anxiety disorder: Secondary | ICD-10-CM | POA: Diagnosis present

## 2023-11-13 DIAGNOSIS — G4733 Obstructive sleep apnea (adult) (pediatric): Secondary | ICD-10-CM | POA: Diagnosis not present

## 2023-11-13 DIAGNOSIS — F4001 Agoraphobia with panic disorder: Secondary | ICD-10-CM | POA: Diagnosis present

## 2023-11-13 DIAGNOSIS — F331 Major depressive disorder, recurrent, moderate: Secondary | ICD-10-CM | POA: Insufficient documentation

## 2023-11-13 NOTE — Progress Notes (Signed)
 Neuropsychological Evaluation   Patient:  Jacqueline Black   DOB: 1995/07/24  MR Number: [id number]  Location: [facility] [place] [facility] [address], [address] [place] [address] Dept: [phone]  Start: 11 AM End: 12 PM  Provider/Observer:     Norleen JONELLE Asa PsyD  Chief Complaint:      Chief Complaint  Patient presents with   Anxiety   Depression   Sleeping Problem   Panic Attack   Pain   Other    Attention and concentration difficulties   11/13/2023: Today I provided feedback regarding the results of the recent neuropsychological evaluation.  Below is a review of the specific session notes from today and below that I will include the reason for service and summary of neuropsychological evaluation for convenience.  The complete neuropsychological evaluation can be found in the patient's EMR dated 08/15/2023.  SESSION SUMMARY FOR FEEDBACK OF NEUROPSYCHOLOGICAL TEST RESULTS:  The results of the neuropsychological evaluation are consistent with a primary diagnosis of major depressive disorder, recurrent, with a significant anxiety disorder. The anxiety disorder presents with features of both generalized anxiety and panic attacks, with some degree of agoraphobia. Depression has reportedly worsened recently. Currently taking Sertraline , prescribed by PCP.  Objective assessment of attentional variables and review of autistic-type behaviors did not identify abnormalities suggestive of a diagnosis of mild autistic spectrum disorder or adult residual attention deficit disorder. Psychological difficulties appear to have developed around age 52. While some rigidity, inflexibility, and restricted interests are noted, these are likely coping mechanisms for the significant long-term, severe anxiety disorder.  Primary attentional issues are likely secondary to the anxiety and depression. Anxiety impacts  attentional abilities, while depressive features affect sustained attention, freedom from distractibility, impulse control, and processing speed. Internal distractors related to anxiety seem to be the primary cause of daily attentional difficulties, as there was no significant distraction by external stimuli during testing.  A significant autoimmune condition (Antiphospholipid antibody syndrome) has increased focus and worry about health and long-term status, psychologically impacting self-esteem and future expectations.  Has a past diagnosis of obstructive sleep apnea and has not been compliant with the CPAP device. Is awaiting re-evaluation from the sleep clinic. Was strongly advised to re-address this issue, as untreated OSA can exacerbate depression and anxiety, and significantly impact attention, short-term memory, and REM sleep, which is crucial for memory and learning.  Counseled on lifestyle factors that can influence mental health. Discussed the importance of good sleep hygiene. Advised on nutrition, emphasizing whole foods (vegetables, whole grains, nuts, seeds) and limiting processed foods to support the gut microbiome and mood. Specifically advised avoiding nitrated foods due to their negative impact on beneficial gut bacteria and links to increased cancer risk. Encouraged regular physical activity, aiming for at least 45 minutes of sustained movement daily.  Discussed the nature of anxiety and depression as disorders, not diseases, and the importance of focusing on controllable factors. Addressed the unhelpful nature of social media narratives around mental health. Pointed out that anxiety can be protective and has likely motivated help-seeking behaviors. Advised on a simple behavioral technique to manage physical manifestations of anxiety, such as leg shaking, by changing foot position.  The goal is to empower through focusing on manageable aspects of health, such as sleep, nutrition, and  exercise, to improve overall quality of life. The evaluation report has been shared with Dr. Coby and the PCP. No further follow-up with me is scheduled  at this time, but can return if new issues arise.   Reason For Service:     Jacqueline Black is a 28 year old female referred for neuropsychological evaluation by her treating psychiatrist Maryellen Barth, MD to facilitate differential diagnostic concerns around possible diagnosis of adult residual attention deficit disorder, autism spectrum type disorder with a longstanding history of anxiety, depression and recent development of panic attacks.  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  Patient has a prior history of DVT, pulmonary embolism and is on current anticoagulant therapies.  Previous diagnosis of obstructive sleep apnea with noncompliance to CPAP prescription.  Patient has described long standing struggles with depression and anxiety over several years that have included sadness, anhedonia, loss of motivation, feelings of helplessness and hopelessness and recurrent thoughts of death.  Ongoing significant sleep disturbance and reduced appetite with concentration and attention problems noted.  Excessive worrying, nervousness and anxiety with feelings of being on edge constantly, trouble relaxing, irritability and fearfulness are all noted.  Patient has had panic attacks going on for the past several years.  Patient currently on sertraline  without significant side effects and improvement to some degree in anxiety and depression but no significant impact on panic events.  Chronic sleep problems are noted with difficulty falling asleep and maintaining sleep.  Patient was prescribed CPAP device previously but is noncompliant.  Patient notes struggle with attention and focus/concentration deficits with primary difficulty  staying on task with difficulty keeping up with projects, struggling with organization and being easily distracted.  Patient denies significant difficulties with attentional issues in school and notes that her problems started sometime around age 73.  Patient has noted there is a history of diagnosis of attention deficit disorder in her family with her son previously being diagnosed and notes that she would like to be assessed for this possibility to see if her son gets it from her.  Patient has chronic illness from her systemic lupus and Evan syndrome and has been unable to work and on current disability.  Patient notes chronic thoughts of not wanting to be here (no specific plan or intent to harm herself) and feels like she is a burden on her family.   During today's clinical visit the patient reports that she would like to be assessed for possible ADD and autism spectrum disorder type symptoms.  Patient notes that her difficulties with attention really started in late middle school but that she always had some degree of difficulties with attention and focusing.  Patient had difficulty developing reading skills and was easily distracted with difficulty comprehending what she read.  Patient describes more inattention and hyperkinetic behaviors.  Patient reports that she has had no panic events since January of this year.   Patient notes that when she was first diagnosed with Evan syndrome around 2014 that her anxiety, depression and panic events really started.  Patient had 4 blood clots in her right leg in 2018 and then had pulmonary embolism in 2018.  Patient has been on chronic blood thinning agents since this time.  Patient notes that she worries about her significant medical illness/chronic issues including antiphospholipid syndrome, Evan syndrome and systemic lupus.   Patient notes that extensive family history of attentional difficulties and other psychiatric issues in the family.  The patient  reports that her parents, brother and son have all been diagnosed or had difficulties with attention and  concentration.  Her son is also been diagnosed with mild spectrum autistic disorder.   Impression/Diagnosis:  The results of the current neuropsychological evaluation are consistent with a primary diagnosis of major depressive disorder that is recurrent and significant anxiety disorder with both generalized anxiety features as well as panic attacks with some degree of agoraphobia.  Neither the patient's review of autistic types of behaviors or objective assessment of a wide range of attentional variables identified particular abnormalities suggestive of a diagnosis of mild autistic spectrum disorder or adult residual attention deficit disorder.  The patient's difficulties psychiatrically and psychologically really appear to have developed around age 62.  While the patient does have some rigidity and inflexibility around behavioral patterns and social interaction and preoccupations and restricted patterns of interest noted related to autistic type features I suspect that most of these have to do with significant and long-term severe anxiety disorder.  I also suspect that the primary attentional issues the patient experiences on a day-to-day basis are due to anxieties impact on attentional abilities and depressive features impacting motivation and anhedonia.  Again, objective assessment of a wide range of attentional abilities including sustained attention, freedom from distractibility, impulse control capacity, focus execute abilities and information processing speed and auditory and visual encoding were all within normal limits.  The patient was not particularly distracted by external distractors and I think the internal distractors related to her anxiety disorder play a primary role in subjective experiences of attentional deficits and depressive features playing a role in her engagement and completion of  tasks.  Patient's current psychotropic regimen including sertraline  appears to be appropriate given the features that are there.  The patient has been diagnosed with significant autoimmune conditions that have heightened her focus on how she is functioning and worries and concerns about her long-term status.  Patient's self-esteem and expectations around her future are all being significantly impacted from a psychological perspective with these medical complications.  Also of importance of note, is the patient has been diagnosed with obstructive sleep apnea in the past and has not been compliant with CPAP device.  I would strongly advise that the patient readdress these issues as untreated obstructive sleep apnea can exacerbate depression and anxiety type symptoms and have a significant impact on attention and short-term memory as it will disproportionately impact REM sleep aspects of sleep patterns that play a crucial role in memory and learning.  I will sit down with the patient for a scheduled feedback visit on 09/11/2023 and go over the results of the current evaluation and make more specific recommendations to the patient herself.   Diagnosis:    MDD (major depressive disorder), recurrent episode, moderate (HCC)  Generalized anxiety disorder  Panic disorder with agoraphobia  Obstructive sleep apnea syndrome in adult   _____________________ Norleen Asa, Psy.D. Clinical Neuropsychologist

## 2023-11-14 DIAGNOSIS — R799 Abnormal finding of blood chemistry, unspecified: Principal | ICD-10-CM

## 2023-11-14 DIAGNOSIS — D5911 Warm autoimmune hemolytic anemia: Principal | ICD-10-CM

## 2023-11-14 DIAGNOSIS — Z1159 Encounter for screening for other viral diseases: Principal | ICD-10-CM

## 2023-12-05 ENCOUNTER — Inpatient Hospital Stay: Admit: 2023-12-05 | Discharge: 2023-12-05 | Payer: MEDICARE

## 2023-12-05 DIAGNOSIS — D6861 Antiphospholipid syndrome: Principal | ICD-10-CM

## 2023-12-05 DIAGNOSIS — D6941 Evans syndrome: Principal | ICD-10-CM

## 2023-12-05 DIAGNOSIS — Z86718 Personal history of other venous thrombosis and embolism: Principal | ICD-10-CM

## 2024-01-03 ENCOUNTER — Ambulatory Visit: Admit: 2024-01-03 | Discharge: 2024-01-04 | Payer: MEDICARE | Attending: Internal Medicine | Primary: Internal Medicine

## 2024-01-03 DIAGNOSIS — M79609 Pain in unspecified limb: Principal | ICD-10-CM

## 2024-01-03 DIAGNOSIS — I2699 Other pulmonary embolism without acute cor pulmonale: Principal | ICD-10-CM

## 2024-01-03 DIAGNOSIS — Z7901 Long term (current) use of anticoagulants: Principal | ICD-10-CM

## 2024-01-03 DIAGNOSIS — D6861 Antiphospholipid syndrome: Principal | ICD-10-CM

## 2024-01-03 DIAGNOSIS — D696 Thrombocytopenia, unspecified: Principal | ICD-10-CM

## 2024-01-03 DIAGNOSIS — R202 Paresthesia of skin: Principal | ICD-10-CM

## 2024-01-16 DIAGNOSIS — M329 Systemic lupus erythematosus, unspecified: Principal | ICD-10-CM

## 2024-01-16 MED ORDER — HYDROXYCHLOROQUINE 200 MG TABLET
ORAL_TABLET | Freq: Every day | ORAL | 3 refills | 90.00000 days
Start: 2024-01-16 — End: 2025-01-15

## 2024-01-17 DIAGNOSIS — R799 Abnormal finding of blood chemistry, unspecified: Principal | ICD-10-CM

## 2024-01-17 DIAGNOSIS — D5911 Warm autoimmune hemolytic anemia    (CMS-HCC): Principal | ICD-10-CM

## 2024-01-17 DIAGNOSIS — Z1159 Encounter for screening for other viral diseases: Principal | ICD-10-CM

## 2024-01-17 MED ORDER — HYDROXYCHLOROQUINE 200 MG TABLET
ORAL_TABLET | Freq: Every day | ORAL | 3 refills | 90.00000 days
Start: 2024-01-17 — End: 2025-01-16

## 2024-01-21 ENCOUNTER — Ambulatory Visit: Admit: 2024-01-21 | Discharge: 2024-01-21 | Payer: MEDICARE

## 2024-01-21 DIAGNOSIS — D6861 Antiphospholipid syndrome: Principal | ICD-10-CM

## 2024-01-21 DIAGNOSIS — M3219 Other organ or system involvement in systemic lupus erythematosus: Principal | ICD-10-CM

## 2024-01-21 DIAGNOSIS — D5911 Warm autoimmune hemolytic anemia    (CMS-HCC): Principal | ICD-10-CM

## 2024-01-21 DIAGNOSIS — E559 Vitamin D deficiency, unspecified: Principal | ICD-10-CM

## 2024-01-21 DIAGNOSIS — Z1159 Encounter for screening for other viral diseases: Principal | ICD-10-CM

## 2024-01-21 DIAGNOSIS — R799 Abnormal finding of blood chemistry, unspecified: Principal | ICD-10-CM

## 2024-01-21 DIAGNOSIS — M329 Systemic lupus erythematosus, unspecified: Principal | ICD-10-CM

## 2024-01-21 DIAGNOSIS — D6941 Evans syndrome: Principal | ICD-10-CM

## 2024-01-21 MED ORDER — HYDROXYCHLOROQUINE 200 MG TABLET
ORAL_TABLET | Freq: Every day | ORAL | 3 refills | 90.00000 days | Status: CP
Start: 2024-01-21 — End: ?

## 2024-01-23 DIAGNOSIS — Z1159 Encounter for screening for other viral diseases: Principal | ICD-10-CM

## 2024-01-23 DIAGNOSIS — D5911 Warm autoimmune hemolytic anemia    (CMS-HCC): Principal | ICD-10-CM

## 2024-01-23 DIAGNOSIS — R799 Abnormal finding of blood chemistry, unspecified: Principal | ICD-10-CM

## 2024-01-26 DIAGNOSIS — E559 Vitamin D deficiency, unspecified: Principal | ICD-10-CM

## 2024-01-26 MED ORDER — ERGOCALCIFEROL (VITAMIN D2) 1,250 MCG (50,000 UNIT) CAPSULE
ORAL_CAPSULE | ORAL | 0 refills | 84.00000 days | Status: CP
Start: 2024-01-26 — End: ?

## 2024-01-31 DIAGNOSIS — D5911 Warm autoimmune hemolytic anemia    (CMS-HCC): Principal | ICD-10-CM

## 2024-01-31 DIAGNOSIS — Z1159 Encounter for screening for other viral diseases: Principal | ICD-10-CM

## 2024-01-31 DIAGNOSIS — R799 Abnormal finding of blood chemistry, unspecified: Principal | ICD-10-CM

## 2024-02-20 DIAGNOSIS — Z1159 Encounter for screening for other viral diseases: Principal | ICD-10-CM

## 2024-02-20 DIAGNOSIS — R799 Abnormal finding of blood chemistry, unspecified: Principal | ICD-10-CM

## 2024-02-20 DIAGNOSIS — D5911 Warm autoimmune hemolytic anemia    (CMS-HCC): Principal | ICD-10-CM

## 2024-02-22 ENCOUNTER — Encounter: Admit: 2024-02-22 | Discharge: 2024-02-23 | Payer: MEDICARE

## 2024-02-22 DIAGNOSIS — M3219 Other organ or system involvement in systemic lupus erythematosus: Principal | ICD-10-CM

## 2024-02-25 ENCOUNTER — Ambulatory Visit: Admit: 2024-02-25 | Payer: MEDICARE

## 2024-03-08 DIAGNOSIS — M3219 Other organ or system involvement in systemic lupus erythematosus: Principal | ICD-10-CM

## 2024-03-10 ENCOUNTER — Encounter: Admit: 2024-03-10 | Discharge: 2024-03-11 | Payer: MEDICARE

## 2024-03-10 DIAGNOSIS — D5911 Warm autoimmune hemolytic anemia    (CMS-HCC): Principal | ICD-10-CM

## 2024-03-10 DIAGNOSIS — Z1159 Encounter for screening for other viral diseases: Principal | ICD-10-CM

## 2024-03-10 DIAGNOSIS — R799 Abnormal finding of blood chemistry, unspecified: Principal | ICD-10-CM

## 2024-03-24 ENCOUNTER — Encounter: Admit: 2024-03-24 | Discharge: 2024-03-24 | Payer: MEDICARE

## 2024-03-24 ENCOUNTER — Ambulatory Visit
Admit: 2024-03-24 | Discharge: 2024-03-24 | Payer: MEDICARE | Attending: Physical Medicine & Rehabilitation | Primary: Physical Medicine & Rehabilitation

## 2024-03-24 DIAGNOSIS — M5416 Radiculopathy, lumbar region: Principal | ICD-10-CM

## 2024-03-27 DIAGNOSIS — D5911 Warm autoimmune hemolytic anemia    (CMS-HCC): Principal | ICD-10-CM

## 2024-03-27 DIAGNOSIS — R799 Abnormal finding of blood chemistry, unspecified: Principal | ICD-10-CM

## 2024-03-27 DIAGNOSIS — Z1159 Encounter for screening for other viral diseases: Principal | ICD-10-CM

## 2024-04-04 DIAGNOSIS — M3219 Other organ or system involvement in systemic lupus erythematosus: Principal | ICD-10-CM

## 2024-04-22 ENCOUNTER — Emergency Department: Admit: 2024-04-22 | Discharge: 2024-04-22 | Discharge status: Against Medical Advice | Payer: MEDICARE

## 2024-05-03 DIAGNOSIS — M3219 Other organ or system involvement in systemic lupus erythematosus: Principal | ICD-10-CM

## 2024-05-22 ENCOUNTER — Ambulatory Visit: Admit: 2024-05-22 | Discharge: 2024-05-22 | Payer: BLUE CROSS/BLUE SHIELD

## 2024-05-22 ENCOUNTER — Ambulatory Visit: Admit: 2024-05-22 | Discharge: 2024-05-22 | Payer: BLUE CROSS/BLUE SHIELD | Attending: Family | Primary: Family

## 2024-05-22 DIAGNOSIS — M329 Systemic lupus erythematosus, unspecified: Principal | ICD-10-CM

## 2024-05-26 DIAGNOSIS — M329 Systemic lupus erythematosus, unspecified: Principal | ICD-10-CM

## 2024-05-26 DIAGNOSIS — Z1159 Encounter for screening for other viral diseases: Principal | ICD-10-CM

## 2024-05-26 DIAGNOSIS — D5911 Warm autoimmune hemolytic anemia    (CMS-HCC): Secondary | ICD-10-CM

## 2024-05-26 DIAGNOSIS — R799 Abnormal finding of blood chemistry, unspecified: Secondary | ICD-10-CM

## 2024-05-26 MED ORDER — METHOTREXATE SODIUM 2.5 MG TABLET
ORAL_TABLET | ORAL | 1 refills | 28.00000 days | Status: CP
Start: 2024-05-26 — End: ?

## 2024-05-26 MED ORDER — FOLIC ACID 1 MG TABLET
ORAL_TABLET | Freq: Every day | ORAL | 3 refills | 90.00000 days | Status: CP
Start: 2024-05-26 — End: 2025-05-26
